# Patient Record
Sex: Male | Born: 1945 | Race: White | Hispanic: No | Smoking: Never smoker
Health system: Southern US, Community
[De-identification: ages and names within clinical notes are randomized; demographics above are authoritative.]

## PROBLEM LIST (undated history)

## (undated) DIAGNOSIS — K219 Gastro-esophageal reflux disease without esophagitis: Secondary | ICD-10-CM

## (undated) DIAGNOSIS — I639 Cerebral infarction, unspecified: Secondary | ICD-10-CM

## (undated) DIAGNOSIS — I10 Essential (primary) hypertension: Secondary | ICD-10-CM

## (undated) DIAGNOSIS — Z87442 Personal history of urinary calculi: Secondary | ICD-10-CM

## (undated) DIAGNOSIS — M1990 Unspecified osteoarthritis, unspecified site: Secondary | ICD-10-CM

## (undated) DIAGNOSIS — N189 Chronic kidney disease, unspecified: Secondary | ICD-10-CM

## (undated) HISTORY — PX: OTHER SURGICAL HISTORY: SHX169

## (undated) HISTORY — PX: EYE SURGERY: SHX253

---

## 1968-10-24 HISTORY — PX: KNEE ARTHROSCOPY W/ ACL RECONSTRUCTION: SHX1858

## 2008-11-15 ENCOUNTER — Emergency Department (HOSPITAL_BASED_OUTPATIENT_CLINIC_OR_DEPARTMENT_OTHER): Admission: EM | Admit: 2008-11-15 | Discharge: 2008-11-15 | Payer: Self-pay | Admitting: Emergency Medicine

## 2008-11-15 ENCOUNTER — Ambulatory Visit: Payer: Self-pay | Admitting: Radiology

## 2008-11-17 ENCOUNTER — Ambulatory Visit (HOSPITAL_BASED_OUTPATIENT_CLINIC_OR_DEPARTMENT_OTHER): Admission: RE | Admit: 2008-11-17 | Discharge: 2008-11-17 | Payer: Self-pay | Admitting: Urology

## 2010-01-02 ENCOUNTER — Ambulatory Visit: Payer: Self-pay | Admitting: Diagnostic Radiology

## 2010-01-02 ENCOUNTER — Emergency Department (HOSPITAL_BASED_OUTPATIENT_CLINIC_OR_DEPARTMENT_OTHER): Admission: EM | Admit: 2010-01-02 | Discharge: 2010-01-02 | Payer: Self-pay | Admitting: Emergency Medicine

## 2010-10-24 DIAGNOSIS — I639 Cerebral infarction, unspecified: Secondary | ICD-10-CM

## 2010-10-24 HISTORY — DX: Cerebral infarction, unspecified: I63.9

## 2011-02-07 LAB — POCT I-STAT 4, (NA,K, GLUC, HGB,HCT)
Potassium: 4.6 mEq/L (ref 3.5–5.1)
Sodium: 137 mEq/L (ref 135–145)

## 2011-02-07 LAB — URINALYSIS, ROUTINE W REFLEX MICROSCOPIC
Bilirubin Urine: NEGATIVE
Glucose, UA: 250 mg/dL — AB
Specific Gravity, Urine: 1.023 (ref 1.005–1.030)
Urobilinogen, UA: 0.2 mg/dL (ref 0.0–1.0)

## 2011-02-07 LAB — URINE MICROSCOPIC-ADD ON

## 2011-03-08 NOTE — Op Note (Signed)
NAME:  Alexander Morris, Alexander Morris                 ACCOUNT NO.:  [id number]   MEDICAL RECORD NO.:  [id number]          PATIENT TYPE:  AMB   LOCATION:  [facility]                         FACILITY:  [facility]   PHYSICIAN:  Excell Seltzer. Annabell Howells, M.D.    DATE OF BIRTH:  1946-03-20   DATE OF PROCEDURE:  11/17/2008  DATE OF DISCHARGE:                               OPERATIVE REPORT   PROCEDURES:  1. Cystoscopy.  2. Left retrograde pyelogram with interpretation.  3. Left ureteral dilation.  4. Left ureteroscopic stone extraction.  5. Placement of left ureteral stent.   SURGEON:  Excell Seltzer. Annabell Howells, M.D.   ANESTHESIA:  General.   SPECIMEN:  Stone.   DRAIN:  A 6-French x 24 cm double-J stent.   COMPLICATIONS:  None.   INDICATIONS:  Alexander Morris is a 65 year old white male who presented with a  3-mm radiolucent left distal ureteral stone.  He elected ureteroscopy  for treatment.   FINDINGS OF PROCEDURE:  The patient was taken to the operating room.  He  received 400 mg of Cipro.  General anesthetic was induced.  He was  placed in lithotomy position.  His perineum and genitalia were prepped  with Betadine solution.  He was draped in the usual sterile fashion.  A  time-out was performed.  Cystoscopy was performed using the 22-French  scope and 12 and 70 degree lenses.  Examination revealed a normal  urethra.  The external sphincter was intact.  The prostatic urethra was  short and bilobar hyperplasia without significant obstruction.  Examination of bladder revealed mild trabeculation, no tumors or  inflammation noted but there were some small stone fragments noted.  The  ureteral orifices are in their normal anatomic position.  There was a  bit of erythema at the left orifice.   After cystoscopy, the left ureter was cannulated with a 5-French open-  ended catheter and contrast was instilled.  This demonstrated a filling  defect approximately 3-4 cm proximal to meatus consistent with a stone.   At this point, the cystoscope  was removed and a 6-French short  ureteroscope was passed.  The scope was advanced up the left ureteral  orifice but could not be advanced to the stone due stricturing.  A  guidewire was passed through ureteroscope past the narrowed area to the  kidney.  At this point, an attempt was made to dilate the stricture with  an access sheath, 12-French inner core dilator but this would not pass.  A 4 cm 15-French balloon was then passed across the narrowing and was  dilated to 12 atmospheres with complete dilation of the stricture.  At  this point, the balloon was removed and the ureteroscope was reinserted  alongside the wire.  The stone was visualized, grasped with a nitinol  basket and removed without difficulty.  However, because of the need for  dilation and some mucosal tearing in the distal ureter related to the  dilation, I felt a stent was indicated.  The cystoscope was reinserted  over the stent of the wire and a 6-French 24 cm double-J stent with  string was passed, under fluoroscopic guidance, without difficulty to  the kidney.  The wire was removed, leaving a good coil in the kidney, a  good coil in the bladder.  The bladder was then drained.  The patient  was taken down from lithotomy position.  His anesthetic was reversed.  He was moved to the recovery room in stable condition.  There were no  complications.  His wife was given the stone and they will bring it to  my office later today for analysis.      Excell Seltzer. Annabell Howells, M.D.  Electronically Signed     JJW/MEDQ  D:  11/17/2008  T:  11/17/2008  Job:  [id number]

## 2011-08-09 ENCOUNTER — Inpatient Hospital Stay (HOSPITAL_COMMUNITY)
Admission: AD | Admit: 2011-08-09 | Discharge: 2011-08-12 | DRG: 065 | Disposition: A | Discharge status: Home / Self Care | Payer: 59 | Source: Other Acute Inpatient Hospital | Attending: Internal Medicine | Admitting: Internal Medicine

## 2011-08-09 ENCOUNTER — Emergency Department (INDEPENDENT_AMBULATORY_CARE_PROVIDER_SITE_OTHER): Payer: 59

## 2011-08-09 ENCOUNTER — Other Ambulatory Visit: Payer: Self-pay

## 2011-08-09 ENCOUNTER — Inpatient Hospital Stay (HOSPITAL_COMMUNITY): Payer: 59

## 2011-08-09 ENCOUNTER — Emergency Department (HOSPITAL_BASED_OUTPATIENT_CLINIC_OR_DEPARTMENT_OTHER)
Admission: EM | Admit: 2011-08-09 | Discharge: 2011-08-09 | Disposition: A | Discharge status: Other Acute Inpatient Hospital | Payer: 59 | Source: Home / Self Care | Attending: Emergency Medicine | Admitting: Emergency Medicine

## 2011-08-09 DIAGNOSIS — E119 Type 2 diabetes mellitus without complications: Secondary | ICD-10-CM | POA: Diagnosis present

## 2011-08-09 DIAGNOSIS — I6350 Cerebral infarction due to unspecified occlusion or stenosis of unspecified cerebral artery: Secondary | ICD-10-CM | POA: Insufficient documentation

## 2011-08-09 DIAGNOSIS — Q211 Atrial septal defect: Secondary | ICD-10-CM

## 2011-08-09 DIAGNOSIS — R209 Unspecified disturbances of skin sensation: Secondary | ICD-10-CM | POA: Insufficient documentation

## 2011-08-09 DIAGNOSIS — I10 Essential (primary) hypertension: Secondary | ICD-10-CM | POA: Insufficient documentation

## 2011-08-09 DIAGNOSIS — R42 Dizziness and giddiness: Secondary | ICD-10-CM

## 2011-08-09 DIAGNOSIS — Z7982 Long term (current) use of aspirin: Secondary | ICD-10-CM

## 2011-08-09 DIAGNOSIS — Z8249 Family history of ischemic heart disease and other diseases of the circulatory system: Secondary | ICD-10-CM

## 2011-08-09 DIAGNOSIS — Z794 Long term (current) use of insulin: Secondary | ICD-10-CM

## 2011-08-09 DIAGNOSIS — Z79899 Other long term (current) drug therapy: Secondary | ICD-10-CM

## 2011-08-09 DIAGNOSIS — R29898 Other symptoms and signs involving the musculoskeletal system: Secondary | ICD-10-CM | POA: Diagnosis present

## 2011-08-09 DIAGNOSIS — I639 Cerebral infarction, unspecified: Secondary | ICD-10-CM

## 2011-08-09 DIAGNOSIS — E669 Obesity, unspecified: Secondary | ICD-10-CM | POA: Diagnosis present

## 2011-08-09 DIAGNOSIS — Q2111 Secundum atrial septal defect: Secondary | ICD-10-CM

## 2011-08-09 HISTORY — DX: Essential (primary) hypertension: I10

## 2011-08-09 LAB — COMPREHENSIVE METABOLIC PANEL
ALT: 14 U/L (ref 0–53)
AST: 19 U/L (ref 0–37)
AST: 18 U/L (ref 0–37)
Albumin: 3.9 g/dL (ref 3.5–5.2)
BUN: 23 mg/dL (ref 6–23)
CO2: 26 mEq/L (ref 19–32)
Calcium: 9.6 mg/dL (ref 8.4–10.5)
Calcium: 9.9 mg/dL (ref 8.4–10.5)
Chloride: 102 mEq/L (ref 96–112)
Creatinine, Ser: 1.1 mg/dL (ref 0.50–1.35)
Creatinine, Ser: 1.08 mg/dL (ref 0.50–1.35)
GFR calc Af Amer: 79 mL/min — ABNORMAL LOW (ref >90)
GFR calc non Af Amer: 69 mL/min — ABNORMAL LOW (ref >90)
GFR calc non Af Amer: 70 mL/min — ABNORMAL LOW (ref >90)
Glucose, Bld: 159 mg/dL — ABNORMAL HIGH (ref 70–99)
Potassium: 4.3 mEq/L (ref 3.5–5.1)
Sodium: 140 mEq/L (ref 135–145)
Total Bilirubin: 0.3 mg/dL (ref 0.3–1.2)
Total Protein: 6.8 g/dL (ref 6.0–8.3)

## 2011-08-09 LAB — CBC
HCT: 35.9 % — ABNORMAL LOW (ref 39.0–52.0)
Hemoglobin: 12.3 g/dL — ABNORMAL LOW (ref 13.0–17.0)
MCH: 31.8 pg (ref 26.0–34.0)
MCHC: 34.5 g/dL (ref 30.0–36.0)
MCHC: 35.2 g/dL (ref 30.0–36.0)
Platelets: 179 10*3/uL (ref 150–400)
Platelets: 174 10*3/uL (ref 150–400)
RDW: 13.1 % (ref 11.5–15.5)

## 2011-08-09 LAB — TROPONIN I: Troponin I: <0.3 ng/mL (ref <0.30)

## 2011-08-09 LAB — DIFFERENTIAL
Basophils Absolute: 0 10*3/uL (ref 0.0–0.1)
Eosinophils Absolute: 0.1 10*3/uL (ref 0.0–0.7)
Lymphocytes Relative: 21 % (ref 12–46)
Lymphs Abs: 1.4 10*3/uL (ref 0.7–4.0)
Monocytes Relative: 8 % (ref 3–12)
Neutro Abs: 4.7 10*3/uL (ref 1.7–7.7)
Neutrophils Relative %: 70 % (ref 43–77)

## 2011-08-09 LAB — PROTIME-INR: INR: 0.98 (ref 0.00–1.49)

## 2011-08-09 LAB — CARDIAC PANEL(CRET KIN+CKTOT+MB+TROPI): Total CK: 110 U/L (ref 7–232)

## 2011-08-09 LAB — APTT: aPTT: 30 seconds (ref 24–37)

## 2011-08-09 MED ORDER — HYDROMORPHONE HCL 1 MG/ML IJ SOLN: 1.0000 mg | Freq: Once | INTRAMUSCULAR | Status: DC

## 2011-08-09 NOTE — ED Notes (Signed)
Swallowing screen performed per protocol.  Pt had no difficulty.  Test passed. Awaiting admission bed.  EMTALA consent obtained.  Pt informed of plan of care.

## 2011-08-09 NOTE — ED Notes (Signed)
Pt report numbness to L side of face only reports symptoms are subsiding

## 2011-08-09 NOTE — ED Provider Notes (Addendum)
History     CSN: [id number] Arrival date & time: 08/09/2011  9:50 AM Patient seen [facility] room 2 10:01 AM  Chief Complaint  Patient presents with  . Numbness  . Hypertension   Patient states numbness left side began yesterday at 8pm yesterday.  States felt weak like bs low yesterday afternoon then improved after several hours.  Patient felt better and went to [employer] and walked on treadmill a little less than usual exertion level.  Did some weight training.  On arriving home tooke bp and was a little high 150/80.  An hour later began having tingling and numbness left face and arm and leg.  No weakness.  Present constantly since last night.  Some dizziness described as having some balance problems.   (Consider location/radiation/quality/duration/timing/severity/associated sxs/prior treatment) The history is provided by the patient.    Past Medical History  Diagnosis Date  . Hypertension   . Diabetes mellitus   reviewed anemia Past Surgical History  Procedure Date  . Eye surgery    revkewed No family history on file.  History  Substance Use Topics  . Smoking status: Never Smoker   . Smokeless tobacco: Never Used  . Alcohol Use: Yes     occasional      Review of Systems  All other systems reviewed and are negative.    Allergies  Review of patient's allergies indicates no known allergies.  Home Medications  No current outpatient prescriptions on file.  BP 180/79  Pulse 70  Temp(Src) 98.3 F (36.8 C) (Oral)  Resp 16  Ht 5\' 8"  (1.727 m)  Wt 170 lb (77.111 kg)  BMI 25.85 kg/m2  SpO2 100%  Physical Exam  Nursing note and vitals reviewed. Constitutional: He is oriented to person, place, and time. He appears well-developed and well-nourished.  HENT:  Head: Normocephalic and atraumatic.  Eyes: Conjunctivae and EOM are normal. Pupils are equal, round, and reactive to light.  Neck: Normal range of motion. Neck supple.  Cardiovascular: Normal rate and regular rhythm.     Pulmonary/Chest: Effort normal and breath sounds normal.  Abdominal: Soft. Bowel sounds are normal.  Musculoskeletal: Normal range of motion.  Neurological: He is alert and oriented to person, place, and time. He has normal strength and normal reflexes. No cranial nerve deficit. He displays a negative Romberg sign. GCS eye subscore is 4. GCS verbal subscore is 5. GCS motor subscore is 6.  Skin: Skin is warm and dry.  Psychiatric: He has a normal mood and affect.    ED Course  Procedures (including critical care time)  Labs Reviewed - No data to display No results found.   No diagnosis found.    MDM  pmd Dr. Larina Bras at [facility] preference [place]   Date: 08/09/2011  Rate: 63  Rhythm: normal sinus rhythm  QRS Axis: normal  Intervals: normal  ST/T Wave abnormalities: normal  Conduction Disutrbances:none  Narrative Interpretation:   Old EKG Reviewed: none available  Patient care discussed with Dr. Viann Fish. Patient will be transported to [facility]. Patient's symptomatology concerning for stroke. He has no overt deficits here but does have decreased sensation throughout the left side.  Hilario Quarry, MD 08/09/11 [id number]  Hilario Quarry, MD 08/09/11 [phone]

## 2011-08-09 NOTE — ED Notes (Signed)
I took ecg and gave copy to Dr. Rosalia Hammers

## 2011-08-09 NOTE — ED Notes (Signed)
Pt transported to radiology.

## 2011-08-09 NOTE — ED Notes (Signed)
MD at bedside. 

## 2011-08-09 NOTE — ED Notes (Signed)
Pt reports dizziness, left sided "numbness" on left cheek, left side of mouth, left arm and leg that started last night about 8:00pm.  He also states blood pressure is elevated.

## 2011-08-09 NOTE — ED Notes (Signed)
Pt returned from radiology.

## 2011-08-09 NOTE — H&P (Signed)
NAMECOSTON, MANDATO                 ACCOUNT NO.:  [id number]  MEDICAL RECORD NO.:  [id number]  LOCATION:  3034                         FACILITY:  [facility]  PHYSICIAN:  Gery Pray, MD      DATE OF BIRTH:  July 15, 1946  DATE OF ADMISSION:  08/09/2011 DATE OF DISCHARGE:                             HISTORY & PHYSICAL   PRIMARY CARE PHYSICIAN:  Marlou Starks, MD  CODE STATUS:  Full code.  The patient goes to team 7.  CHIEF COMPLAINT:  Left-sided numbness.  HISTORY OF PRESENT ILLNESS:  This is a pleasant 65 year old gentleman, who has risk factors of diabetes mellitus, high blood pressure.  He states that he came home yesterday.  His blood pressure was 190/90.  He rechecked later and it went down to 160/80.  At that point, he went to the gym. When he came home after 45 minutes of working out, his blood pressure was still 160/80.  Approximately 1 hour later, he states he developed left- sided numbness in the arms and legs and left side of the face.  He went to bed.  When he got up this a.m. he noted the numbness was better, but he decided to follow up with his PCP.  In the PCP's office, his blood pressure was 190/89.  He was sent to the ER and transferred to [facility]  The patient states that he had no headache, he had no slurred speech, he had no blurred vision, he had no tinnitus.  He had some mild left-sided localized weakness.  No gait abnormality.  He had no problems with swallowing.  He had no altered mental status.  No fevers.  No nausea.  No vomiting.  No chills.  He has never had this type of symptom before.  He did take 2 baby aspirin yesterday before going to the gym. He states his blood pressure normally has fair control.  Systolic blood pressure usually below 145-150.  He reports he had no chest pains.  Here in the hospital, he states that his symptoms have mostly resolved.  He does still have some mild left leg numbness and some mild left facial numbness; otherwise, all  symptoms have resolved.  History obtained from the patient who appears reliable.  His wife and his daughter both at the bedside.  REVIEW OF SYSTEMS:  All 10-point systems reviewed and negative except as noted in HPI.  PAST MEDICAL HISTORY:  Significant for diabetes and hypertension.  The patient's diabetes is usually well controlled.  PAST SURGICAL HISTORY: 1. Eyes surgery. 2. Orthopedic surgeries as a teenager.  MEDICATIONS: 1. Lantus 30 units subcu every hour sleep. 2. Ramipril 10 mg daily. 3. Actos 45 mg daily.  ALLERGIES:  No known drug allergies.  SOCIAL HISTORY:  Negative tobacco, alcohol, or illicit drugs.  He lives at home with his wife.  Currently, he is on a home oxygen.  He does use a walker.  He does all of his ADLs.  FAMILY HISTORY:  Significant for coronary artery disease in his father and Parkinson's disease in his .  PHYSICAL EXAMINATION:  VITAL SIGNS:  Blood pressure 166/73, pulse 55, respirations 20, temperature 97.7, satting 100% on  room air. GENERAL:  Alert and oriented male, currently in no acute distress. HEENT:  Eyes, pink conjunctivae.  PERRLA.  ENT, moist oral mucosa. Trachea midline. NECK:  Supple.  No thyromegaly. LUNGS:  Clear to auscultation bilaterally.  No wheeze.  No use of accessory muscles. CARDIOVASCULAR:  Regular rate and rhythm without murmurs, rigors, or gallops.  No JVD. ABDOMEN:  Soft, positive bowel sounds, nontender, nondistended.  No organomegaly. NEURO:  Cranial nerves II through XII grossly intact.  Sensation on examination appears to be grossly intact. MUSCULOSKELETAL:  Strength is 5/5 in all extremities.  No clubbing, cyanosis, or edema. SKIN:  No rashes.  No subcutaneous crepitations. PSYCH:  Alert and oriented, appropriate gentleman.  LABORATORY DATA:  CT head, no acute disease.  EKG, there is no EKG noted on the chart; however, tele strips show normal sinus rhythm.  ASSESSMENT AND PLAN: 1. Transient ischemic  attack. 2. Malignant hypertension.  The patient's blood pressure is already     significantly improved.  The patient will be brought in TIA/CVA     orders and further imaging will be done including an echo and     carotid ultrasound.  Aspirin will be ordered daily.  IV fluids.  We     will monitor patient on tele.  Labetalol p.r.n. systolic blood     pressure greater than 190.  We will obtain an EKG.  PT will see the     patient.  The patient has already been evaluated with swallow study     which he has passed and resume ADA diet.  A lipid panel will also     be ordered, and if needed, cholesterol medications will be added. 3. Diabetes mellitus.  We will resume home medications.  Place the     patient on ADA diet and continue to monitor.          ______________________________ Gery Pray, MD     DC/MEDQ  D:  08/09/2011  T:  08/09/2011  Job:  [id number]  Electronically Signed by Gery Pray MD on 08/09/2011 08:34:49 PM

## 2011-08-10 LAB — HEMOGLOBIN A1C
Hgb A1c MFr Bld: 6.3 % — ABNORMAL HIGH (ref <5.7)
Mean Plasma Glucose: 134 mg/dL — ABNORMAL HIGH (ref <117)

## 2011-08-10 LAB — CBC
MCH: 31.9 pg (ref 26.0–34.0)
Platelets: 171 10*3/uL (ref 150–400)
RBC: 3.76 MIL/uL — ABNORMAL LOW (ref 4.22–5.81)

## 2011-08-10 LAB — BASIC METABOLIC PANEL
CO2: 26 mEq/L (ref 19–32)
Calcium: 9.5 mg/dL (ref 8.4–10.5)
GFR calc non Af Amer: 51 mL/min — ABNORMAL LOW (ref >90)
Potassium: 3.9 mEq/L (ref 3.5–5.1)
Sodium: 141 mEq/L (ref 135–145)

## 2011-08-10 LAB — URINALYSIS, ROUTINE W REFLEX MICROSCOPIC
Glucose, UA: NEGATIVE mg/dL
Hgb urine dipstick: NEGATIVE
Leukocytes, UA: NEGATIVE
Protein, ur: NEGATIVE mg/dL
Specific Gravity, Urine: 1.022 (ref 1.005–1.030)
pH: 5.5 (ref 5.0–8.0)

## 2011-08-10 LAB — LIPID PANEL
Cholesterol: 120 mg/dL (ref 0–200)
HDL: 30 mg/dL — ABNORMAL LOW (ref >39)
Triglycerides: 110 mg/dL (ref <150)
VLDL: 22 mg/dL (ref 0–40)

## 2011-08-10 LAB — GLUCOSE, CAPILLARY: Glucose-Capillary: 72 mg/dL (ref 70–99)

## 2011-08-11 ENCOUNTER — Inpatient Hospital Stay (HOSPITAL_COMMUNITY): Payer: 59

## 2011-08-11 DIAGNOSIS — I517 Cardiomegaly: Secondary | ICD-10-CM

## 2011-08-12 LAB — GLUCOSE, CAPILLARY
Glucose-Capillary: 68 mg/dL — ABNORMAL LOW (ref 70–99)
Glucose-Capillary: 78 mg/dL (ref 70–99)

## 2011-08-22 NOTE — Discharge Summary (Signed)
NAMEJASHER, Alexander Morris                 ACCOUNT NO.:  [id number]  MEDICAL RECORD NO.:  [id number]  LOCATION:  3034                         FACILITY:  [facility]  PHYSICIAN:  Altha Harm, MDDATE OF BIRTH:  [birth year]  DATE OF ADMISSION:  08/09/2011 DATE OF DISCHARGE:  08/12/2011                              DISCHARGE SUMMARY   DISCHARGE DISPOSITION:  Home.  FINAL DISCHARGE DIAGNOSES: 1. Right thalamic infarct. 2. Possible atrial septal defect. 3. Hypertension. 4. Diabetes type 2.  DISCHARGE MEDICATIONS: 1. Aspirin 325 mg p.o. daily. 2. Hydrochlorothiazide 12.5 mg p.o. daily. 3. Potassium chloride 20 mEq p.o. daily. 4. Crestor 20 mg p.o. daily. 5. Iron sulfate 325 mg p.o. daily with meals. 6. Lantus 30 units subcutaneously at bedtime. 7. Actos 45 mg p.o. daily. 8. Ramipril 10 mg p.o. daily.  CONSULTANTS:  Dr. Thad Ranger and Pramod P. Pearlean Brownie, MD, Neurology.  PROCEDURES:  None.  DIAGNOSTIC STUDIES: 1. 2D echocardiogram which shows left ventricular cavity size normal     with mildly increased wall thickness in the pattern of left     ventricular hypertrophy.  Left ventricular function preserved with     an ejection fraction of 60%.  Cannot rule out possibilities of a     small shunt at atrial septal level. 2. CT of the head without contrast which shows no acute intracranial     abnormalities. 3. Two-view chest x-ray which shows no acute abnormalities. 4. MRI of the head shows acute or subacute nonhemorrhagic infarct     involving the right lateral thalamus, mild generalized atrophy,     minimal sinus disease. 5. MRA of the brain which shows mild narrowing of the proximal right     vertebral artery, measuring less than 50% relative to the distal     vessel.  No other significant proximal stenosis, aneurysm, or     branch-vessel occlusion.  PRIMARY CARE PHYSICIAN:  Marlou Starks, MD  ALLERGIES:  No known drug allergies.  CODE STATUS:  Full code.  CHIEF COMPLAINT:   Left-sided numbness of face.  HISTORY OF PRESENT ILLNESS:  Please refer to the H and P by Dr. Joneen Roach for details of the HPI.  However, in short, this is a 65 year old gentleman with a history of diabetes type 2, hypertension who presented to the emergency room with elevated blood pressure of 190/90.  The patient states that he came home and after 45 minutes of working out, his blood pressure is still elevated.  He developed left-sided numbness of the arms, legs, and left side of his face.  The patient went to bed, got up and noticed that the arms and legs are better, but he still had persistent numbness of the face.  He decided to follow up in his primary care physician's office.  At his primary care physician's office, his blood pressure was 190/98 and he was sent to the ER for further evaluation and management.  HOSPITAL COURSE: 1. Right thalamic CVA.  The patient certainly presented with symptoms     consistent with a CVA.  The patient had a CT scan which showed no     infarct, however, subsequent MRI showed right thalamic infarct. MRA  was done to evaluate the vessels which showed no significant     vessel involvement.  There was a 2D echocardiogram which showed a     suggestion of an ASD.  The patient was initially scheduled for     bubble study, however, Dr. Pearlean Brownie saw the patient today and     recommended the patient can go home without the bubble study, and     follow up as an outpatient for this particular since the patient     was eager to be discharged from the hospital.  He felt that the     patient's risk was not significantly increased and that the     patient, regardless, would be of aspirin which he is going home on.     The patient is being discharged on aspirin 325 mg p.o. and also on     Crestor for independent stroke risk reduction. 2. Malignant hypertension.  The patient's blood pressures were     significantly elevated.  Here in the hospital, the blood pressures      have been ranging in the 150 systolic.  The patient is being     discharged home on ramipril and hydrochlorothiazide has also been     added to aid in the lowering of his blood pressure.  The patient     will likely need to have further titration of his medications.  His     goal blood pressure is 125/85. 3. Diabetes type 2.  Blood pressures are well controlled.  Hemoglobin     A1c was just slightly elevated.  The patient is continued on his     pioglitazone.  At the time of discharge, the patient is stable.  PHYSICAL EXAMINATION:  VITAL SIGNS:  Temperature is 97.9, heart rate 68, blood pressure 154/80, respiratory rate 18, O2 sats are 99% on room air. HEENT:  Normocephalic, atraumatic.  Pupils equal, round, and reactive to light and accommodation.  Extraocular movements are intact.  Oropharynx is moist.  No exudate, erythema, or lesions are noted. NECK:  Trachea is midline.  No masses.  No thyromegaly.  No JVD.  No carotid bruit. RESPIRATORY:  The patient has a normal respiratory effort.  Equal excursion bilaterally.  No wheezing or rhonchi noted. CARDIOVASCULAR:  He has got a normal S1, S2.  No murmurs, rubs, or gallops noted.  PMI is nondisplaced.  No heaves or thrills on palpation. ABDOMEN:  Obese, soft, nontender, nondistended.  No masses.  No hepatosplenomegaly noted. EXTREMITIES:  Showed no clubbing, cyanosis, or edema. PSYCHIATRIC:  Alert and oriented x3.  Good insight and cognition.  Good recent and remote recall.  The patient appears to have no focal neurological deficits as a result of a CVA.  He has some just very mild left facial numbness.  DIETARY RESTRICTIONS:  The patient should be on a diabetic heart-healthy diet.  PHYSICAL RESTRICTIONS:  None.  FOLLOWUP:  The patient is to follow up with his primary care physician, Dr. Marlou Starks, within 3-5 days.  He is also to follow up with Neurology within 2 months, the number has been provided.  Total time for this  discharge process including face-to-face time approximately 40 minutes.     Altha Harm, MD     MAM/MEDQ  D:  08/12/2011  T:  08/13/2011  Job:  [id number]  cc:   Dr. Kandice Moos P. Pearlean Brownie, MD Dr. Joneen Roach Marlou Starks, MD  Electronically Signed by Marthann Schiller MD on 08/22/2011 01:08:40 PM

## 2014-01-06 ENCOUNTER — Other Ambulatory Visit: Payer: Self-pay | Admitting: Urology

## 2014-01-06 ENCOUNTER — Encounter (HOSPITAL_COMMUNITY): Payer: Self-pay | Admitting: Pharmacy Technician

## 2014-01-06 ENCOUNTER — Encounter (HOSPITAL_COMMUNITY)
Admission: AD | Disposition: A | Discharge status: Home / Self Care | Payer: Self-pay | Source: Ambulatory Visit | Attending: Urology

## 2014-01-06 ENCOUNTER — Encounter (HOSPITAL_COMMUNITY): Payer: Self-pay | Admitting: *Deleted

## 2014-01-06 ENCOUNTER — Ambulatory Visit (HOSPITAL_COMMUNITY)
Admission: AD | Admit: 2014-01-06 | Discharge: 2014-01-06 | Disposition: A | Discharge status: Home / Self Care | Payer: 59 | Source: Ambulatory Visit | Attending: Urology | Admitting: Urology

## 2014-01-06 ENCOUNTER — Ambulatory Visit (HOSPITAL_COMMUNITY): Payer: 59

## 2014-01-06 ENCOUNTER — Encounter (HOSPITAL_COMMUNITY): Payer: 59 | Admitting: Certified Registered Nurse Anesthetist

## 2014-01-06 ENCOUNTER — Ambulatory Visit (HOSPITAL_COMMUNITY): Payer: 59 | Admitting: Certified Registered Nurse Anesthetist

## 2014-01-06 DIAGNOSIS — N201 Calculus of ureter: Secondary | ICD-10-CM

## 2014-01-06 DIAGNOSIS — E119 Type 2 diabetes mellitus without complications: Secondary | ICD-10-CM | POA: Insufficient documentation

## 2014-01-06 DIAGNOSIS — N1330 Unspecified hydronephrosis: Secondary | ICD-10-CM | POA: Insufficient documentation

## 2014-01-06 DIAGNOSIS — R82998 Other abnormal findings in urine: Secondary | ICD-10-CM | POA: Insufficient documentation

## 2014-01-06 DIAGNOSIS — N200 Calculus of kidney: Secondary | ICD-10-CM | POA: Insufficient documentation

## 2014-01-06 DIAGNOSIS — Z794 Long term (current) use of insulin: Secondary | ICD-10-CM | POA: Insufficient documentation

## 2014-01-06 DIAGNOSIS — I10 Essential (primary) hypertension: Secondary | ICD-10-CM | POA: Insufficient documentation

## 2014-01-06 HISTORY — PX: CYSTOSCOPY WITH RETROGRADE PYELOGRAM, URETEROSCOPY AND STENT PLACEMENT: SHX5789

## 2014-01-06 LAB — CBC
HCT: 36.4 % — ABNORMAL LOW (ref 39.0–52.0)
Hemoglobin: 12.1 g/dL — ABNORMAL LOW (ref 13.0–17.0)
MCH: 30.9 pg (ref 26.0–34.0)
MCHC: 33.2 g/dL (ref 30.0–36.0)
MCV: 92.9 fL (ref 78.0–100.0)
PLATELETS: 191 10*3/uL (ref 150–400)
RBC: 3.92 MIL/uL — ABNORMAL LOW (ref 4.22–5.81)
RDW: 14.1 % (ref 11.5–15.5)
WBC: 12.4 10*3/uL — ABNORMAL HIGH (ref 4.0–10.5)

## 2014-01-06 LAB — BASIC METABOLIC PANEL
BUN: 33 mg/dL — ABNORMAL HIGH (ref 6–23)
CO2: 25 meq/L (ref 19–32)
Calcium: 9.4 mg/dL (ref 8.4–10.5)
Chloride: 96 mEq/L (ref 96–112)
Creatinine, Ser: 2.64 mg/dL — ABNORMAL HIGH (ref 0.50–1.35)
GFR calc non Af Amer: 23 mL/min — ABNORMAL LOW (ref >90)
GFR, EST AFRICAN AMERICAN: 27 mL/min — AB (ref >90)
Glucose, Bld: 196 mg/dL — ABNORMAL HIGH (ref 70–99)
POTASSIUM: 4.3 meq/L (ref 3.7–5.3)
Sodium: 136 mEq/L — ABNORMAL LOW (ref 137–147)

## 2014-01-06 LAB — GLUCOSE, CAPILLARY
GLUCOSE-CAPILLARY: 162 mg/dL — AB (ref 70–99)
Glucose-Capillary: 181 mg/dL — ABNORMAL HIGH (ref 70–99)

## 2014-01-06 SURGERY — CYSTOURETEROSCOPY, WITH RETROGRADE PYELOGRAM AND STENT INSERTION
Anesthesia: General | Site: Ureter | Laterality: Left

## 2014-01-06 MED ORDER — FENTANYL CITRATE 0.05 MG/ML IJ SOLN
INTRAMUSCULAR | Status: AC
  Filled 2014-01-06: qty 2

## 2014-01-06 MED ORDER — FENTANYL CITRATE 0.05 MG/ML IJ SOLN
INTRAMUSCULAR | Status: DC | PRN
  Administered 2014-01-06 (×2): 50 ug via INTRAVENOUS

## 2014-01-06 MED ORDER — SENNOSIDES-DOCUSATE SODIUM 8.6-50 MG PO TABS: 1.0000 | ORAL_TABLET | Freq: Two times a day (BID) | ORAL | Status: DC

## 2014-01-06 MED ORDER — PHENAZOPYRIDINE HCL 100 MG PO TABS: 100.0000 mg | ORAL_TABLET | Freq: Three times a day (TID) | ORAL | Status: DC | PRN

## 2014-01-06 MED ORDER — LIDOCAINE HCL 2 % EX GEL
CUTANEOUS | Status: DC | PRN
  Administered 2014-01-06: 1 via URETHRAL

## 2014-01-06 MED ORDER — BELLADONNA ALKALOIDS-OPIUM 16.2-60 MG RE SUPP
RECTAL | Status: AC
  Filled 2014-01-06: qty 1

## 2014-01-06 MED ORDER — METOCLOPRAMIDE HCL 5 MG/ML IJ SOLN
INTRAMUSCULAR | Status: AC
  Filled 2014-01-06: qty 2

## 2014-01-06 MED ORDER — MIDAZOLAM HCL 5 MG/5ML IJ SOLN
INTRAMUSCULAR | Status: DC | PRN
  Administered 2014-01-06: 2 mg via INTRAVENOUS

## 2014-01-06 MED ORDER — LIDOCAINE HCL (CARDIAC) 20 MG/ML IV SOLN
INTRAVENOUS | Status: DC | PRN
  Administered 2014-01-06: 100 mg via INTRAVENOUS

## 2014-01-06 MED ORDER — METOCLOPRAMIDE HCL 5 MG/ML IJ SOLN
INTRAMUSCULAR | Status: DC | PRN
  Administered 2014-01-06: 10 mg via INTRAVENOUS

## 2014-01-06 MED ORDER — ONDANSETRON HCL 4 MG/2ML IJ SOLN
INTRAMUSCULAR | Status: AC
  Filled 2014-01-06: qty 2

## 2014-01-06 MED ORDER — CEFAZOLIN SODIUM-DEXTROSE 2-3 GM-% IV SOLR
2.0000 g | INTRAVENOUS | Status: AC
  Administered 2014-01-06: 2 g via INTRAVENOUS

## 2014-01-06 MED ORDER — OXYBUTYNIN CHLORIDE 5 MG PO TABS: 5.0000 mg | ORAL_TABLET | Freq: Four times a day (QID) | ORAL | Status: DC | PRN

## 2014-01-06 MED ORDER — ONDANSETRON HCL 4 MG/2ML IJ SOLN
INTRAMUSCULAR | Status: DC | PRN
  Administered 2014-01-06: 4 mg via INTRAVENOUS

## 2014-01-06 MED ORDER — SODIUM CHLORIDE 0.9 % IR SOLN
Status: DC | PRN
  Administered 2014-01-06: 3000 mL via INTRAVESICAL

## 2014-01-06 MED ORDER — IOHEXOL 350 MG/ML SOLN
INTRAVENOUS | Status: DC | PRN
  Administered 2014-01-06: 8 mL

## 2014-01-06 MED ORDER — MIDAZOLAM HCL 2 MG/2ML IJ SOLN
INTRAMUSCULAR | Status: AC
  Filled 2014-01-06: qty 2

## 2014-01-06 MED ORDER — DEXAMETHASONE SODIUM PHOSPHATE 10 MG/ML IJ SOLN
INTRAMUSCULAR | Status: AC
  Filled 2014-01-06: qty 1

## 2014-01-06 MED ORDER — LIDOCAINE HCL 2 % EX GEL
CUTANEOUS | Status: AC
  Filled 2014-01-06: qty 10

## 2014-01-06 MED ORDER — 0.9 % SODIUM CHLORIDE (POUR BTL) OPTIME
TOPICAL | Status: DC | PRN
  Administered 2014-01-06: 1000 mL

## 2014-01-06 MED ORDER — BELLADONNA ALKALOIDS-OPIUM 16.2-60 MG RE SUPP
RECTAL | Status: DC | PRN
  Administered 2014-01-06: 1 via RECTAL

## 2014-01-06 MED ORDER — LACTATED RINGERS IV SOLN
INTRAVENOUS | Status: DC | PRN
  Administered 2014-01-06: 20:00:00 via INTRAVENOUS

## 2014-01-06 MED ORDER — CEFAZOLIN SODIUM-DEXTROSE 2-3 GM-% IV SOLR
INTRAVENOUS | Status: AC
  Filled 2014-01-06: qty 50

## 2014-01-06 MED ORDER — OXYCODONE-ACETAMINOPHEN 5-325 MG PO TABS: 1.0000 | ORAL_TABLET | ORAL | Status: DC | PRN

## 2014-01-06 MED ORDER — PROPOFOL 10 MG/ML IV BOLUS
INTRAVENOUS | Status: AC
  Filled 2014-01-06: qty 20

## 2014-01-06 MED ORDER — CIPROFLOXACIN HCL 500 MG PO TABS: 500.0000 mg | ORAL_TABLET | Freq: Two times a day (BID) | ORAL | Status: DC

## 2014-01-06 MED ORDER — TAMSULOSIN HCL 0.4 MG PO CAPS: 0.4000 mg | ORAL_CAPSULE | Freq: Every day | ORAL | Status: DC

## 2014-01-06 MED ORDER — PROPOFOL 10 MG/ML IV BOLUS
INTRAVENOUS | Status: DC | PRN
  Administered 2014-01-06: 250 mg via INTRAVENOUS

## 2014-01-06 SURGICAL SUPPLY — 28 items
BAG URO CATCHER STRL LF (DRAPE) ×2 IMPLANT
BASKET LASER NITINOL 1.9FR (BASKET) IMPLANT
BASKET STNLS GEMINI 4WIRE 3FR (BASKET) IMPLANT
BASKET ZERO TIP NITINOL 2.4FR (BASKET) IMPLANT
CATH CLEAR GEL 3F BACKSTOP (CATHETERS) IMPLANT
CATH URET 5FR 28IN CONE TIP (BALLOONS)
CATH URET 5FR 28IN OPEN ENDED (CATHETERS) ×2 IMPLANT
CATH URET 5FR 70CM CONE TIP (BALLOONS) IMPLANT
CATH URET DUAL LUMEN 6-10FR 50 (CATHETERS) IMPLANT
CLOTH BEACON ORANGE TIMEOUT ST (SAFETY) ×2 IMPLANT
DRAPE CAMERA CLOSED 9X96 (DRAPES) ×2 IMPLANT
FIBER LASER FLEXIVA 200 (UROLOGICAL SUPPLIES) IMPLANT
FIBER LASER FLEXIVA 365 (UROLOGICAL SUPPLIES) IMPLANT
GLOVE BIOGEL M 7.0 STRL (GLOVE) ×2 IMPLANT
GLOVE BIOGEL PI IND STRL 7.0 (GLOVE) ×2 IMPLANT
GLOVE BIOGEL PI INDICATOR 7.0 (GLOVE) ×2
GOWN STRL REUS W/TWL LRG LVL3 (GOWN DISPOSABLE) ×4 IMPLANT
GUIDEWIRE ANG ZIPWIRE 038X150 (WIRE) IMPLANT
GUIDEWIRE STR DUAL SENSOR (WIRE) ×2 IMPLANT
IV NS IRRIG 3000ML ARTHROMATIC (IV SOLUTION) ×2 IMPLANT
PACK CYSTO (CUSTOM PROCEDURE TRAY) ×2 IMPLANT
SCRUB PCMX 4 OZ (MISCELLANEOUS) ×2 IMPLANT
SHEATH ACCESS URETERAL 38CM (SHEATH) IMPLANT
SHEATH URET ACCESS 12FR/35CM (UROLOGICAL SUPPLIES) IMPLANT
SHEATH URET ACCESS 12FR/55CM (UROLOGICAL SUPPLIES) IMPLANT
STENT POLARIS 5FRX24 (STENTS) ×2 IMPLANT
SYRINGE IRR TOOMEY STRL 70CC (SYRINGE) IMPLANT
TUBING CONNECTING 10 (TUBING) ×2 IMPLANT

## 2014-01-06 NOTE — Anesthesia Preprocedure Evaluation (Addendum)
Anesthesia Evaluation  Patient identified by MRN, date of birth, ID band Patient awake    Reviewed: Allergy & Precautions, H&P , NPO status , Patient's Chart, lab work & pertinent test results  Airway Mallampati: II TM Distance: >3 FB Neck ROM: Full    Dental no notable dental hx.    Pulmonary neg pulmonary ROS,  breath sounds clear to auscultation  Pulmonary exam normal       Cardiovascular hypertension, Pt. on medications negative cardio ROS  Rhythm:Regular Rate:Normal     Neuro/Psych negative neurological ROS  negative psych ROS   GI/Hepatic negative GI ROS, Neg liver ROS,   Endo/Other  diabetes, Type 2, Insulin Dependent, Oral Hypoglycemic Agents  Renal/GU negative Renal ROS  negative genitourinary   Musculoskeletal negative musculoskeletal ROS (+)   Abdominal   Peds negative pediatric ROS (+)  Hematology negative hematology ROS (+)   Anesthesia Other Findings   Reproductive/Obstetrics negative OB ROS                          Anesthesia Physical Anesthesia Plan  ASA: III  Anesthesia Plan: General   Post-op Pain Management:    Induction: Intravenous  Airway Management Planned: Oral ETT  Additional Equipment:   Intra-op Plan:   Post-operative Plan: Extubation in OR  Informed Consent: I have reviewed the patients History and Physical, chart, labs and discussed the procedure including the risks, benefits and alternatives for the proposed anesthesia with the patient or authorized representative who has indicated his/her understanding and acceptance.   Dental advisory given  Plan Discussed with: CRNA  Anesthesia Plan Comments:         Anesthesia Quick Evaluation

## 2014-01-06 NOTE — H&P (Signed)
Urology History and Physical Exam  CC: Left ureter stone. History of left ureter stricture. Possible UTI.  HPI:    68 year old male patient previously saw Dr. Annabell HowellsWrenn in 2010 for stones and left ureter stricture recurs today with several problems.   #1 pyuria.  This is a new problem.  He's had this previously.  This is an acute issue.  He has too numerous to count white blood cells and red blood cells as well as positive side esterase and nitrites.  Does not associate with fever.  Nothing makes this better or worse.  #2 nephrolithiasis.  Chronic problem.  This is an acute onset.  This began Saturday.  Located on the left side.  Left-sided ointment pain.  Associated with radiation to the ipsilateral abdomen.  Sharp and intermittent in nature.  Associated with nausea and emesis.  Not associated with fever.  We discussed imaging options which include CT, KUB, and ultrasound along with risks, limitations, and benefits.  He has not had a CT since 2010.  He elected to proceed with CT abdomen and pelvis stone protocol which was done in urology clinic today. No kidney stones identified on CT today.  #3 left ureter stone.  This located at the level of the aortic bifurcation.  This is possibly 6.5 mm in size.  Density: 558 HU.  Positive proximal hydronephrosis.  Positive perinephric stranding. We discussed management options along with risks, benefits, side effects, alternatives, and likelihood of achieving goals.   PMH: Past Medical History  Diagnosis Date  . Hypertension   . Diabetes mellitus     PSH: Past Surgical History  Procedure Laterality Date  . Eye surgery      Allergies: No Known Allergies  Medications: No prescriptions prior to admission     Social History: History   Social History  . Marital Status: Married    Spouse Name: N/A    Number of Children: N/A  . Years of Education: N/A   Occupational History  . Not on file.   Social History Main Topics  . Smoking status:  Never Smoker   . Smokeless tobacco: Never Used  . Alcohol Use: Yes     Comment: occasional  . Drug Use: No  . Sexual Activity:    Other Topics Concern  . Not on file   Social History Narrative  . No narrative on file    Family History: No family history on file.  Review of Systems: Positive: Polydipsia, nausea, emesis, constipation. Negative: Fever, chest pain, or gross hematuria.  A further 10 point review of systems was negative except what is listed in the HPI.  Physical Exam: Filed Vitals:   01/06/14 1740  BP: 172/77  Pulse: 83  Temp: 98.2 F (36.8 C)  Resp: 22    General: No acute distress.  Awake. Head:  Normocephalic.  Atraumatic. ENT:  EOMI.  Mucous membranes moist Neck:  Supple.  No lymphadenopathy. CV:  S1 present. S2 present. Regular rate. Pulmonary: Equal effort bilaterally.  Clear to auscultation bilaterally. Abdomen: Soft.  Non- tender to palpation. Skin:  Normal turgor.  No visible rash. Extremity: No gross deformity of bilateral upper extremities.  No gross deformity of    bilateral lower extremities. Neurologic: Alert. Appropriate mood.    Studies:  No results found for this basename: HGB, WBC, PLT,  in the last 72 hours  No results found for this basename: NA, K, CL, CO2, BUN, CREATININE, CALCIUM, MAGNESIUM, GFRNONAA, GFRAA,  in the last 72 hours  No results found for this basename: PT, INR, APTT,  in the last 72 hours   No components found with this basename: ABG,     Assessment:  Left ureter stone. History of left ureter stricture. Possible UTI.  Plan:  Urine culture from clinic pending.  Given that he likely has an infection, history of a left ureter stricture, and a current left ureter stone with some obstruction, I recommended proceeding to operating room today for cystoscopy, left vertebra program, left ureter stent placement, and possible left ureter dilation.

## 2014-01-06 NOTE — Transfer of Care (Signed)
Immediate Anesthesia Transfer of Care Note  Patient: Alexander Morris  Procedure(s) Performed: Procedure(s) (LRB): CYSTOSCOPY WITH  LEFT RETROGRADE PYELOGRAM, AND LEFT STENT PLACEMENT  (Left)  Patient Location: PACU  Anesthesia Type: General  Level of Consciousness: sedated, patient cooperative and responds to stimulation  Airway & Oxygen Therapy: Patient Spontanous Breathing and Patient connected to face mask oxgen  Post-op Assessment: Report given to PACU RN and Post -op Vital signs reviewed and stable  Post vital signs: Reviewed and stable  Complications: No apparent anesthesia complications

## 2014-01-06 NOTE — Op Note (Signed)
Urology Operative Report  Date of Procedure: 01/06/14  Surgeon: Natalia Leatherwoodaniel Daney Moor, MD Assistant:  None  Preoperative Diagnosis: Left ureter stone. Possible UTI. Postoperative Diagnosis: Same  Procedure(s): Left ureter stent placement. Left retrograde pyelogram with interpretation. Cystoscopy  Estimated blood loss: none  Specimen: None  Drains: None  Complications: None  Findings: Left mid-proximal ureter filling defect consistent with known left ureter stone. Return of dark urinary sediment. No evidence of left ureter stricture on retrograde pyelogram.  History of present illness: Patient presents for possible urinary tract infection. He has a left ureter stone. He presents for left ureter stent. 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   Procedure in detail: After informed consent was obtained, the patient was taken to the operating room. They were placed in the supine position. SCDs were turned on and in place. IV antibiotics were infused, and general anesthesia was induced. A timeout was performed in which the correct patient, surgical site, and procedure were identified and agreed upon by the team.  The patient was placed in a dorsolithotomy position, making sure to pad all pertinent neurovascular pressure points. The genitals were prepped and draped in the usual sterile fashion.  A rigid cystoscope was best the urethra and into the bladder. The bladder was drained and then fully distended. It was evaluated in a systematic fashion to visualize the entire bladder. This negative for tumors.  Attention was turned to the left ureter orifice. It was cannulated with a 5 [place] ureter catheter. I injected 8 cc of Omnipaque to obtain a retrograde pyelogram. This was positive for filling defect in the mid proximal ureter consistent with the known stone. This was negative for any narrowing or stricture. Curiously, did not note any hydronephrosis of the renal pelvis.  I  placed a sensor wire through the left ureter orifice and up into the left renal pelvis on fluoroscopy. Over the wire I placed a 5 x 24 Polaris stent without tether. This was placed with ease. It was deployed with a good curl in the left renal pelvis and the loops within the bladder. There was return of dark urinary sediment.  The bladder was drained, the cystoscope was removed, I placed 10 cc of lidocaine jelly into the urethra, and I placed a belladonna and opium suppository to the rectum.  This completed the procedure, he's placed back in a supine position, anesthesia was reversed, and he was taken to the PACU in stable condition.  All counts were correct at the end of the case.  I will give him ciprofloxacin for 7 days in followup the results of the urine culture. He will be scheduled for interval ureteroscopy for his stone.

## 2014-01-06 NOTE — Discharge Instructions (Signed)
DISCHARGE INSTRUCTIONS FOR KIDNEY STONES OR URETERAL STENT ° °MEDICATIONS:  ° °1.  Resume all your other meds from home. ° °ACTIVITY °1. No strenuous activity x 1week °2. No driving while on narcotic pain medications °3. Drink plenty of water °4. Continue to walk at home - you can still get blood clots when you are at home, so keep active, but don't over do it. °5. May return to work in 3 days. ° °BATHING °1. You can shower or take a bath. ° ° ° °SIGNS/SYMPTOMS TO CALL: °1. Please call us if you have a fever greater than 101.5, uncontrolled  °nausea/vomiting, uncontrolled pain, dizziness, unable to urinate, chest pain, shortness of breath, leg swelling, leg pain, redness around wound, drainage from wound, or any other concerns or questions. ° °You can reach us at [phone]. ° ° °

## 2014-01-07 ENCOUNTER — Encounter (HOSPITAL_COMMUNITY): Payer: Self-pay | Admitting: Urology

## 2014-01-07 NOTE — Anesthesia Postprocedure Evaluation (Signed)
  Anesthesia Post-op Note  Patient: Alexander Morris  Procedure(s) Performed: Procedure(s) (LRB): CYSTOSCOPY WITH  LEFT RETROGRADE PYELOGRAM, AND LEFT STENT PLACEMENT  (Left)  Patient Location: PACU  Anesthesia Type: General  Level of Consciousness: awake and alert   Airway and Oxygen Therapy: Patient Spontanous Breathing  Post-op Pain: mild  Post-op Assessment: Post-op Vital signs reviewed, Patient's Cardiovascular Status Stable, Respiratory Function Stable, Patent Airway and No signs of Nausea or vomiting  Last Vitals:  Filed Vitals:   01/06/14 2208  BP: 154/58  Pulse:   Temp:   Resp: 15    Post-op Vital Signs: stable   Complications: No apparent anesthesia complications

## 2014-01-10 ENCOUNTER — Other Ambulatory Visit: Payer: Self-pay | Admitting: Urology

## 2014-01-20 ENCOUNTER — Ambulatory Visit (HOSPITAL_COMMUNITY)
Admission: RE | Admit: 2014-01-20 | Discharge: 2014-01-20 | Disposition: A | Discharge status: Home / Self Care | Payer: 59 | Source: Ambulatory Visit | Attending: Urology | Admitting: Urology

## 2014-01-20 ENCOUNTER — Encounter (HOSPITAL_COMMUNITY): Payer: Self-pay

## 2014-01-20 ENCOUNTER — Encounter (HOSPITAL_COMMUNITY): Payer: Self-pay | Admitting: Pharmacy Technician

## 2014-01-20 ENCOUNTER — Encounter (HOSPITAL_COMMUNITY)
Admission: RE | Admit: 2014-01-20 | Discharge: 2014-01-20 | Disposition: A | Discharge status: Home / Self Care | Payer: 59 | Source: Ambulatory Visit | Attending: Urology | Admitting: Urology

## 2014-01-20 DIAGNOSIS — Z01812 Encounter for preprocedural laboratory examination: Secondary | ICD-10-CM | POA: Insufficient documentation

## 2014-01-20 DIAGNOSIS — I10 Essential (primary) hypertension: Secondary | ICD-10-CM | POA: Insufficient documentation

## 2014-01-20 DIAGNOSIS — Z01818 Encounter for other preprocedural examination: Secondary | ICD-10-CM | POA: Insufficient documentation

## 2014-01-20 HISTORY — DX: Personal history of urinary calculi: Z87.442

## 2014-01-20 HISTORY — DX: Cerebral infarction, unspecified: I63.9

## 2014-01-20 HISTORY — DX: Unspecified osteoarthritis, unspecified site: M19.90

## 2014-01-20 LAB — CBC
HCT: 32.9 % — ABNORMAL LOW (ref 39.0–52.0)
Hemoglobin: 10.7 g/dL — ABNORMAL LOW (ref 13.0–17.0)
MCH: 30.3 pg (ref 26.0–34.0)
MCHC: 32.5 g/dL (ref 30.0–36.0)
MCV: 93.2 fL (ref 78.0–100.0)
PLATELETS: 209 10*3/uL (ref 150–400)
RBC: 3.53 MIL/uL — AB (ref 4.22–5.81)
RDW: 13.6 % (ref 11.5–15.5)
WBC: 7 10*3/uL (ref 4.0–10.5)

## 2014-01-20 LAB — BASIC METABOLIC PANEL
BUN: 18 mg/dL (ref 6–23)
CO2: 25 meq/L (ref 19–32)
CREATININE: 1.49 mg/dL — AB (ref 0.50–1.35)
Calcium: 9.5 mg/dL (ref 8.4–10.5)
Chloride: 104 mEq/L (ref 96–112)
GFR calc non Af Amer: 47 mL/min — ABNORMAL LOW (ref >90)
GFR, EST AFRICAN AMERICAN: 54 mL/min — AB (ref >90)
Glucose, Bld: 130 mg/dL — ABNORMAL HIGH (ref 70–99)
Potassium: 4.4 mEq/L (ref 3.7–5.3)
SODIUM: 140 meq/L (ref 137–147)

## 2014-01-20 NOTE — Pre-Procedure Instructions (Signed)
EKG REPORT IN EPIC FROM 01-06-14. CXR REPORT IN EPIC FROM 01/06/14 - ABNORMAL "SMALL LEFT PLEURAL EFFUSION" - CXR REPEATED TODAY PREOP .

## 2014-01-20 NOTE — Patient Instructions (Addendum)
   [facility]  ON:  Tuesday  4/7  REPORT TO  SHORT STAY CENTER AT:  5:30 AM      PHONE # FOR SHORT STAY IS [phone]  DO NOT EAT OR DRINK ANYTHING AFTER MIDNIGHT THE NIGHT BEFORE YOUR SURGERY.  YOU MAY BRUSH YOUR TEETH, RINSE OUT YOUR MOUTH--BUT NO WATER, NO FOOD, NO CHEWING GUM, NO MINTS, NO CANDIES, NO CHEWING TOBACCO.  PLEASE TAKE THE FOLLOWING MEDICATIONS THE AM OF YOUR SURGERY WITH A FEW SIPS OF WATER:   DILTIAZEM, CRESTOR.  OXYCODONE / ACETAMINOPHEN IF NEEDED FOR PAIN.   IF YOU ARE DIABETIC:  DO NOT TAKE ANY DIABETIC MEDICATIONS THE AM OF YOUR SURGERY.  IF YOU TAKE INSULIN IN THE EVENINGS--PLEASE ONLY TAKE 1/2 NORMAL EVENING DOSE THE NIGHT BEFORE YOUR SURGERY.  NO INSULIN THE AM OF YOUR SURGERY.   DO NOT BRING VALUABLES, MONEY, CREDIT CARDS.  DO NOT WEAR JEWELRY, MAKE-UP, NAIL POLISH AND NO METAL PINS OR CLIPS IN YOUR HAIR. CONTACT LENS, DENTURES / PARTIALS, GLASSES SHOULD NOT BE WORN TO SURGERY AND IN MOST CASES-HEARING AIDS WILL NEED TO BE REMOVED.  BRING YOUR GLASSES CASE, ANY EQUIPMENT NEEDED FOR YOUR CONTACT LENS. FOR PATIENTS ADMITTED TO THE HOSPITAL--CHECK OUT TIME THE DAY OF DISCHARGE IS 11:00 AM.  ALL INPATIENT ROOMS ARE PRIVATE - WITH BATHROOM, TELEPHONE, TELEVISION AND WIFI INTERNET.  IF YOU ARE BEING DISCHARGED THE SAME DAY OF YOUR SURGERY--YOU CAN NOT DRIVE YOURSELF HOME--AND SHOULD NOT GO HOME ALONE BY TAXI OR BUS.  NO DRIVING OR OPERATING MACHINERY, OR MAKING LEGAL DECISIONS FOR 24 HOURS FOLLOWING ANESTHESIA / PAIN MEDICATIONS.  PLEASE MAKE ARRANGEMENTS FOR SOMEONE TO BE WITH YOU AT HOME THE FIRST 24 HOURS AFTER SURGERY. RESPONSIBLE DRIVER'S NAME / PHONE                                                    FAILURE TO FOLLOW THESE INSTRUCTIONS MAY RESULT IN THE CANCELLATION OF YOUR SURGERY. PLEASE BE AWARE THAT YOU MAY NEED ADDITIONAL BLOOD DRAWN DAY OF YOUR SURGERY  PATIENT SIGNATURE_________________________________

## 2014-01-27 NOTE — H&P (Signed)
Urology History and Physical Exam  CC: Left ureter stone.  HPI:  68 year old male presents today for left ureter stone.  This was discovered on CT scan on 01/06/14.  It is 7 mm in size.  Location at the aortic bifurcation.  It was associated with left-sided flank pain. There are no stones in the left kidney. Left ureter stent was placed 01/06/14.  History of left distal ureter stricture, but no stricture was noted on retrograde pyelogram during stent placement.  We have discussed management options along with risks, benefits, alternatives, side effects, and likelihood of achieving goals.  He presents today for cystoscopy, left ureteroscopy, laser lithotripsy, left ureter stent exchange, and possible left retrograde pyelogram.  Urine culture 01/13/14 was negative for growth.  PMH: Past Medical History  Diagnosis Date  . Hypertension   . Diabetes mellitus     PT ON INSULIN - TYPE II  . Stroke 2012    MINI-STROKE-EXPERIENCE SLIGHT NUMBNESS LEFT SIDE AND ELEVATED B/P--STILL HAS SOME NUMBNESS AND WEAKNESS BUT ABLE TO USE ARM AND LEG  . History of kidney stones     MOST RECENT - 01/06/14 - HAD CYSTO AND STENT PLACEMENT -LEFT  . Arthritis     MINOR - HANDS    PSH: Past Surgical History  Procedure Laterality Date  . Cystoscopy with retrograde pyelogram, ureteroscopy and stent placement Left 01/06/2014    Procedure: CYSTOSCOPY WITH  LEFT RETROGRADE PYELOGRAM, AND LEFT STENT PLACEMENT ;  Surgeon: Milford Cageaniel Young Corian Handley, MD;  Location: [facility];  Service: Urology;  Laterality: Left;  Marland Kitchen. Eye surgery      LASER BOTH EYES FOR DIABETIC COMPLICATIONS  . 2010 removal of kidney stone / cyst0      Allergies: Allergies  Allergen Reactions  . Scallops [Shellfish Allergy] Nausea And Vomiting    Medications: No prescriptions prior to admission     Social History: History   Social History  . Marital Status: Married    Spouse Name: N/A    Number of Children: N/A  . Years of Education: N/A    Occupational History  . Not on file.   Social History Main Topics  . Smoking status: Never Smoker   . Smokeless tobacco: Never Used  . Alcohol Use: Yes     Comment: occasional  . Drug Use: No  . Sexual Activity: Not on file   Other Topics Concern  . Not on file   Social History Narrative  . No narrative on file    Family History: No family history on file.  Review of Systems: Positive: Urgency. Negative: Fever, SOB, or chest pain.  A further 10 point review of systems was negative except what is listed in the HPI.  Physical Exam: Filed Vitals:   01/28/14 0530  BP: 155/67  Pulse: 56  Temp: 98.5 F (36.9 C)  Resp: 18    General: No acute distress.  Awake. Head:  Normocephalic.  Atraumatic. ENT:  EOMI.  Mucous membranes moist Neck:  Supple.  No lymphadenopathy. CV:  S1 present. S2 present. Regular rate. Pulmonary: Equal effort bilaterally.  Clear to auscultation bilaterally. Abdomen: Soft.  Non- tender to palpation. Skin:  Normal turgor.  No visible rash. Extremity: No gross deformity of bilateral upper extremities.  No gross deformity of    bilateral lower extremities. Neurologic: Alert. Appropriate mood.    Studies:  No results found for this basename: HGB, WBC, PLT,  in the last 72 hours  No results found for this basename: NA, K, CL,  CO2, BUN, CREATININE, CALCIUM, MAGNESIUM, GFRNONAA, GFRAA,  in the last 72 hours   No results found for this basename: PT, INR, APTT,  in the last 72 hours   No components found with this basename: ABG,     Assessment:  Left ureter stone.  Plan: To OR  for cystoscopy, left ureteroscopy, laser lithotripsy, left ureter stent exchange, and possible left retrograde pyelogram.

## 2014-01-28 ENCOUNTER — Encounter (HOSPITAL_COMMUNITY): Payer: Self-pay | Admitting: *Deleted

## 2014-01-28 ENCOUNTER — Encounter (HOSPITAL_COMMUNITY): Payer: 59 | Admitting: Certified Registered Nurse Anesthetist

## 2014-01-28 ENCOUNTER — Ambulatory Visit (HOSPITAL_COMMUNITY): Payer: 59 | Admitting: Certified Registered Nurse Anesthetist

## 2014-01-28 ENCOUNTER — Encounter (HOSPITAL_COMMUNITY)
Admission: RE | Disposition: A | Discharge status: Home / Self Care | Payer: Self-pay | Source: Ambulatory Visit | Attending: Urology

## 2014-01-28 ENCOUNTER — Ambulatory Visit (HOSPITAL_COMMUNITY)
Admission: RE | Admit: 2014-01-28 | Discharge: 2014-01-28 | Disposition: A | Discharge status: Home / Self Care | Payer: 59 | Source: Ambulatory Visit | Attending: Urology | Admitting: Urology

## 2014-01-28 DIAGNOSIS — N201 Calculus of ureter: Secondary | ICD-10-CM | POA: Diagnosis present

## 2014-01-28 DIAGNOSIS — R209 Unspecified disturbances of skin sensation: Secondary | ICD-10-CM | POA: Insufficient documentation

## 2014-01-28 DIAGNOSIS — Z794 Long term (current) use of insulin: Secondary | ICD-10-CM | POA: Insufficient documentation

## 2014-01-28 DIAGNOSIS — E119 Type 2 diabetes mellitus without complications: Secondary | ICD-10-CM | POA: Diagnosis not present

## 2014-01-28 DIAGNOSIS — I69998 Other sequelae following unspecified cerebrovascular disease: Secondary | ICD-10-CM | POA: Insufficient documentation

## 2014-01-28 DIAGNOSIS — R29898 Other symptoms and signs involving the musculoskeletal system: Secondary | ICD-10-CM | POA: Diagnosis not present

## 2014-01-28 DIAGNOSIS — I10 Essential (primary) hypertension: Secondary | ICD-10-CM | POA: Insufficient documentation

## 2014-01-28 HISTORY — PX: HOLMIUM LASER APPLICATION: SHX5852

## 2014-01-28 HISTORY — PX: CYSTOSCOPY WITH RETROGRADE PYELOGRAM, URETEROSCOPY AND STENT PLACEMENT: SHX5789

## 2014-01-28 LAB — GLUCOSE, CAPILLARY: Glucose-Capillary: 80 mg/dL (ref 70–99)

## 2014-01-28 SURGERY — CYSTOURETEROSCOPY, WITH RETROGRADE PYELOGRAM AND STENT INSERTION
Anesthesia: General | Site: Ureter | Laterality: Left

## 2014-01-28 MED ORDER — LACTATED RINGERS IV SOLN: INTRAVENOUS | Status: DC

## 2014-01-28 MED ORDER — OXYCODONE-ACETAMINOPHEN 5-325 MG PO TABS: 1.0000 | ORAL_TABLET | ORAL | Status: DC | PRN

## 2014-01-28 MED ORDER — BELLADONNA ALKALOIDS-OPIUM 16.2-60 MG RE SUPP
RECTAL | Status: AC
  Filled 2014-01-28: qty 1

## 2014-01-28 MED ORDER — PROMETHAZINE HCL 25 MG/ML IJ SOLN: 6.2500 mg | INTRAMUSCULAR | Status: DC | PRN

## 2014-01-28 MED ORDER — LIDOCAINE HCL 2 % EX GEL
CUTANEOUS | Status: DC | PRN
  Administered 2014-01-28: 1 via URETHRAL

## 2014-01-28 MED ORDER — FENTANYL CITRATE 0.05 MG/ML IJ SOLN
INTRAMUSCULAR | Status: DC | PRN
  Administered 2014-01-28 (×2): 25 ug via INTRAVENOUS
  Administered 2014-01-28 (×2): 50 ug via INTRAVENOUS

## 2014-01-28 MED ORDER — HYDROMORPHONE HCL PF 1 MG/ML IJ SOLN: 0.2500 mg | INTRAMUSCULAR | Status: DC | PRN

## 2014-01-28 MED ORDER — LIDOCAINE HCL 2 % EX GEL
CUTANEOUS | Status: AC
  Filled 2014-01-28: qty 10

## 2014-01-28 MED ORDER — BELLADONNA ALKALOIDS-OPIUM 16.2-60 MG RE SUPP
RECTAL | Status: DC | PRN
  Administered 2014-01-28: 1 via RECTAL

## 2014-01-28 MED ORDER — LACTATED RINGERS IV SOLN
INTRAVENOUS | Status: DC | PRN
  Administered 2014-01-28 (×2): via INTRAVENOUS

## 2014-01-28 MED ORDER — CEFAZOLIN SODIUM-DEXTROSE 2-3 GM-% IV SOLR
2.0000 g | INTRAVENOUS | Status: AC
  Administered 2014-01-28: 2 g via INTRAVENOUS

## 2014-01-28 MED ORDER — ONDANSETRON HCL 4 MG/2ML IJ SOLN
INTRAMUSCULAR | Status: DC | PRN
  Administered 2014-01-28 (×2): 2 mg via INTRAVENOUS

## 2014-01-28 MED ORDER — KETOROLAC TROMETHAMINE 30 MG/ML IJ SOLN
INTRAMUSCULAR | Status: DC | PRN
  Administered 2014-01-28: 30 mg via INTRAVENOUS

## 2014-01-28 MED ORDER — SODIUM CHLORIDE 0.9 % IR SOLN
Status: DC | PRN
  Administered 2014-01-28: 1000 mL

## 2014-01-28 MED ORDER — 0.9 % SODIUM CHLORIDE (POUR BTL) OPTIME
TOPICAL | Status: DC | PRN
  Administered 2014-01-28: 4000 mL

## 2014-01-28 MED ORDER — PROPOFOL 10 MG/ML IV BOLUS
INTRAVENOUS | Status: DC | PRN
  Administered 2014-01-28: 30 mg via INTRAVENOUS
  Administered 2014-01-28: 170 mg via INTRAVENOUS

## 2014-01-28 MED ORDER — CEPHALEXIN 500 MG PO CAPS: 500.0000 mg | ORAL_CAPSULE | Freq: Three times a day (TID) | ORAL | Status: DC

## 2014-01-28 MED ORDER — CEFAZOLIN SODIUM-DEXTROSE 2-3 GM-% IV SOLR
INTRAVENOUS | Status: AC
  Filled 2014-01-28: qty 50

## 2014-01-28 MED ORDER — LIDOCAINE HCL (CARDIAC) 20 MG/ML IV SOLN
INTRAVENOUS | Status: DC | PRN
  Administered 2014-01-28: 75 mg via INTRAVENOUS

## 2014-01-28 MED ORDER — MIDAZOLAM HCL 5 MG/5ML IJ SOLN
INTRAMUSCULAR | Status: DC | PRN
  Administered 2014-01-28 (×2): 0.5 mg via INTRAVENOUS

## 2014-01-28 SURGICAL SUPPLY — 31 items
BAG URO CATCHER STRL LF (DRAPE) ×4 IMPLANT
BASKET LASER NITINOL 1.9FR (BASKET) IMPLANT
BASKET STNLS GEMINI 4WIRE 3FR (BASKET) IMPLANT
BASKET ZERO TIP NITINOL 2.4FR (BASKET) IMPLANT
CATH CLEAR GEL 3F BACKSTOP (CATHETERS) ×4 IMPLANT
CATH URET 5FR 28IN CONE TIP (BALLOONS)
CATH URET 5FR 28IN OPEN ENDED (CATHETERS) IMPLANT
CATH URET 5FR 70CM CONE TIP (BALLOONS) IMPLANT
CATH URET DUAL LUMEN 6-10FR 50 (CATHETERS) IMPLANT
CLOTH BEACON ORANGE TIMEOUT ST (SAFETY) ×4 IMPLANT
DRAPE CAMERA CLOSED 9X96 (DRAPES) ×4 IMPLANT
EXTRACTOR STONE NITINOL NGAGE (UROLOGICAL SUPPLIES) ×4 IMPLANT
FIBER LASER FLEXIVA 200 (UROLOGICAL SUPPLIES) ×4 IMPLANT
FIBER LASER FLEXIVA 365 (UROLOGICAL SUPPLIES) IMPLANT
GLOVE BIOGEL M 7.0 STRL (GLOVE) ×4 IMPLANT
GOWN STRL REUS W/TWL LRG LVL3 (GOWN DISPOSABLE) ×4 IMPLANT
GOWN STRL REUS W/TWL XL LVL3 (GOWN DISPOSABLE) ×12 IMPLANT
GUIDEWIRE ANG ZIPWIRE 038X150 (WIRE) IMPLANT
GUIDEWIRE STR DUAL SENSOR (WIRE) ×8 IMPLANT
IV NS IRRIG 3000ML ARTHROMATIC (IV SOLUTION) IMPLANT
MANIFOLD NEPTUNE II (INSTRUMENTS) ×4 IMPLANT
PACK CYSTO (CUSTOM PROCEDURE TRAY) ×4 IMPLANT
SCRUB PCMX 4 OZ (MISCELLANEOUS) IMPLANT
SHEATH ACCESS URETERAL 38CM (SHEATH) ×4 IMPLANT
SHEATH URET ACCESS 12FR/35CM (UROLOGICAL SUPPLIES) IMPLANT
SHEATH URET ACCESS 12FR/55CM (UROLOGICAL SUPPLIES) IMPLANT
STENT POLARIS 5FRX24 (STENTS) ×4 IMPLANT
SYRINGE IRR TOOMEY STRL 70CC (SYRINGE) IMPLANT
TUBING CONNECTING 10 (TUBING) ×3 IMPLANT
TUBING CONNECTING 10' (TUBING) ×1
VALVE ENDOSCOPIC W/ADAPTER (MISCELLANEOUS) ×4 IMPLANT

## 2014-01-28 NOTE — Op Note (Signed)
Urology Operative Report  Date of Procedure: 01/28/14  Surgeon: Natalia Leatherwoodaniel Lennis Korb, MD Assistant:  None  Preoperative Diagnosis: Left ureter stone. Postoperative Diagnosis:  Same  Procedure(s): Left ureteroscopy with laser lithotripsy and stone removal. Left ureter stent placement (5 x 24 polaris with tether). Left ureter stent removal. Cystoscopy.  Estimated blood loss: None  Specimen: Stones sent to [facility] for analysis.  Drains: None  Complications: None  Findings: Left distal ureter stone.  History of present illness: 68 year old male presents for a left ureter stone. He had a left ureter stent placed previously.   Procedure in detail: After informed consent was obtained, the patient was taken to the operating room. They were placed in the supine position. SCDs were turned on and in place. IV antibiotics were infused, and general anesthesia was induced. A timeout was performed in which the correct patient, surgical site, and procedure were identified and agreed upon by the team.  The patient was placed in a dorsolithotomy position, making sure to pad all pertinent neurovascular pressure points. The genitals were prepped and draped in the usual sterile fashion.  A rigid cystoscope was advanced through the urethra and into the bladder. Attention was turned to the left ureter orifice. A stent grasper was used to remove the stent to the urethral meatus. A sensor wire was placed up the stent into the left renal pelvis on fluoroscopy. The stent was removed. The wire was secured as a safety wire. A second sensor wire was placed as a working wire up the left ureter and into the left renal pelvis.  I then placed the internal obturator to the 12-14 ureter access sheath over the working wire under fluoroscopy into the distal ureter. This went with ease. I then placed the obturator and sheath over the wire under fluoroscopy with ease into the distal ureter. I removed the obturator and wire.  I  then placed the flexible digital ureter scope through the access sheath into the left ureter. I was able to navigate to where I encountered the stone which had migrated down to the distal ureter. This was too large to be removed. Lithotripsy was carried out with a 200  holmium laser filament at a setting of 0.5 J and 5 Hz. Backstop gel was placed proximal to the stone under fluoroscopy. The stone was fragmented into smaller pieces and these pieces were removed with a basket.  After all stones were removed I then navigated the ureteroscope up the ureter to the renal pelvis. The renal calyces were evaluated in a systematic fashion. There were no remaining stone fragments noted. I withdrew the ureter scope to visualize the entire ureter. There is no injury to the mucosa or ureter.  Due to mild amount of edema I elected to place a ureter stent. I placed a 5 x 24 Polaris stent over the safety wire through the cystoscope and deployed with a good curl in the left renal pelvis and the loops in the bladder. The tethering string was left intact. I then drained the bladder of the stone fragments and these were sent to [facility] for chemical analysis.  The bladder was drained, I placed 10 cc of lidocaine jelly into the urethra, and I placed a belladonna and opium suppository into the rectum. This completed the procedure, anesthesia was reversed, was placed back in supine position, and taken to the PACU in a stable condition.  All counts were correct at the end of the case.  He will remove the stent this  coming Friday; he'll start Keflex the day prior.

## 2014-01-28 NOTE — Anesthesia Preprocedure Evaluation (Signed)
Anesthesia Evaluation  Patient identified by MRN, date of birth, ID band Patient awake    Reviewed: Allergy & Precautions, H&P , NPO status , Patient's Chart, lab work & pertinent test results  Airway Mallampati: II TM Distance: >3 FB Neck ROM: Full    Dental no notable dental hx. (+) Caps, Teeth Intact, Dental Advisory Given   Pulmonary neg pulmonary ROS,  breath sounds clear to auscultation  Pulmonary exam normal       Cardiovascular hypertension, Pt. on medications negative cardio ROS  Rhythm:Regular Rate:Normal     Neuro/Psych CVA negative neurological ROS  negative psych ROS   GI/Hepatic negative GI ROS, Neg liver ROS,   Endo/Other  diabetes, Type 2, Insulin Dependent  Renal/GU negative Renal ROS  negative genitourinary   Musculoskeletal negative musculoskeletal ROS (+)   Abdominal   Peds negative pediatric ROS (+)  Hematology negative hematology ROS (+)   Anesthesia Other Findings   Reproductive/Obstetrics negative OB ROS                           Anesthesia Physical Anesthesia Plan  ASA: III  Anesthesia Plan: General   Post-op Pain Management:    Induction: Intravenous  Airway Management Planned: LMA  Additional Equipment:   Intra-op Plan:   Post-operative Plan: Extubation in OR  Informed Consent: I have reviewed the patients History and Physical, chart, labs and discussed the procedure including the risks, benefits and alternatives for the proposed anesthesia with the patient or authorized representative who has indicated his/her understanding and acceptance.   Dental advisory given  Plan Discussed with: CRNA  Anesthesia Plan Comments:         Anesthesia Quick Evaluation

## 2014-01-28 NOTE — Anesthesia Procedure Notes (Signed)
Procedure Name: LMA Insertion Date/Time: 01/28/2014 7:33 AM Performed by: Edison PaceGRAY, Mischa Pollard E Pre-anesthesia Checklist: Patient identified, Patient being monitored, Timeout performed, Emergency Drugs available and Suction available Patient Re-evaluated:Patient Re-evaluated prior to inductionOxygen Delivery Method: Circle system utilized Preoxygenation: Pre-oxygenation with 100% oxygen Intubation Type: IV induction LMA: LMA inserted LMA Size: 4.0 Number of attempts: 1 Placement Confirmation: positive ETCO2 Tube secured with: Tape Dental Injury: Teeth and Oropharynx as per pre-operative assessment

## 2014-01-28 NOTE — Transfer of Care (Signed)
Immediate Anesthesia Transfer of Care Note  Patient: Alexander Morris  Procedure(s) Performed: Procedure(s) with comments: CYSTOSCOPY WITH RETROGRADE PYELOGRAM, URETEROSCOPY AND STENT PLACEMENT (Left) - LEFT URETER STENT EXCHANGE POSSIBLE LEFT URETER DILATION       HOLMIUM LASER APPLICATION (Left)  Patient Location: PACU  Anesthesia Type:General  Level of Consciousness: awake, alert , oriented and patient cooperative  Airway & Oxygen Therapy: Patient Spontanous Breathing and Patient connected to face mask oxygen  Post-op Assessment: Report given to PACU RN, Post -op Vital signs reviewed and stable and Patient moving all extremities  Post vital signs: Reviewed and stable  Complications: No apparent anesthesia complications

## 2014-01-28 NOTE — Discharge Instructions (Signed)
DISCHARGE INSTRUCTIONS FOR KIDNEY STONES OR URETERAL STENT  MEDICATIONS:   1. Resume all your other meds from home.  2. Start the antibiotic, keflex, the day before you remove your stent.  ACTIVITY 1. No strenuous activity x 1week 2. No driving while on narcotic pain medications 3. Drink plenty of water 4. Continue to walk at home - you can still get blood clots when you are at home, so keep active, but don't over do it. 5. May return to work in 3 days.  BATHING 1. You can shower. 2. If you have a string coming from your urethra:  The stent string is attached to your ureteral stent.  Do not pull on this.  If the stent gets pulled our partially before it is time to remove it, go ahead and remove the entire stent.  Call if you develop significant pain that lasts more than an hour.    SIGNS/SYMPTOMS TO CALL: 1. Please call us if you have a fever greater than 101.5, uncontrolled  nausea/vomiting, uncontrolled pain, dizziness, unable to urinate, chest pain, shortness of breath, leg swelling, leg pain, redness around wound, drainage from wound, or any other concerns or questions.  You can reach us at [phone].  FOLLOW-UP 1. If you have a string attached to your stent, you may remove it on Friday, 01/31/14.  To do this, pull the string until the stent is completely removed.  You may feel an odd sensation in your back.

## 2014-01-28 NOTE — Anesthesia Postprocedure Evaluation (Signed)
Anesthesia Post Note  Patient: Alexander Morris  Procedure(s) Performed: Procedure(s) (LRB): CYSTOSCOPY WITH RETROGRADE PYELOGRAM, URETEROSCOPY AND STENT PLACEMENT (Left) HOLMIUM LASER APPLICATION (Left)  Anesthesia type: General  Patient location: PACU  Post pain: Pain level controlled  Post assessment: Post-op Vital signs reviewed  Last Vitals:  Filed Vitals:   01/28/14 1046  BP: 135/59  Pulse: 54  Temp: 36.5 C  Resp: 16    Post vital signs: Reviewed  Level of consciousness: sedated  Complications: No apparent anesthesia complications

## 2014-01-29 ENCOUNTER — Encounter (HOSPITAL_COMMUNITY): Payer: Self-pay | Admitting: Urology

## 2014-05-22 ENCOUNTER — Emergency Department (HOSPITAL_BASED_OUTPATIENT_CLINIC_OR_DEPARTMENT_OTHER)
Admission: EM | Admit: 2014-05-22 | Discharge: 2014-05-22 | Disposition: A | Discharge status: Home / Self Care | Payer: 59 | Attending: Emergency Medicine | Admitting: Emergency Medicine

## 2014-05-22 ENCOUNTER — Emergency Department (HOSPITAL_BASED_OUTPATIENT_CLINIC_OR_DEPARTMENT_OTHER): Payer: 59

## 2014-05-22 ENCOUNTER — Encounter (HOSPITAL_BASED_OUTPATIENT_CLINIC_OR_DEPARTMENT_OTHER): Payer: Self-pay | Admitting: Emergency Medicine

## 2014-05-22 DIAGNOSIS — Z23 Encounter for immunization: Secondary | ICD-10-CM | POA: Insufficient documentation

## 2014-05-22 DIAGNOSIS — Z8673 Personal history of transient ischemic attack (TIA), and cerebral infarction without residual deficits: Secondary | ICD-10-CM | POA: Diagnosis not present

## 2014-05-22 DIAGNOSIS — M129 Arthropathy, unspecified: Secondary | ICD-10-CM | POA: Diagnosis not present

## 2014-05-22 DIAGNOSIS — Z87442 Personal history of urinary calculi: Secondary | ICD-10-CM | POA: Diagnosis not present

## 2014-05-22 DIAGNOSIS — I10 Essential (primary) hypertension: Secondary | ICD-10-CM | POA: Insufficient documentation

## 2014-05-22 DIAGNOSIS — Z794 Long term (current) use of insulin: Secondary | ICD-10-CM | POA: Diagnosis not present

## 2014-05-22 DIAGNOSIS — Z87828 Personal history of other (healed) physical injury and trauma: Secondary | ICD-10-CM | POA: Insufficient documentation

## 2014-05-22 DIAGNOSIS — E119 Type 2 diabetes mellitus without complications: Secondary | ICD-10-CM | POA: Diagnosis not present

## 2014-05-22 DIAGNOSIS — Z79899 Other long term (current) drug therapy: Secondary | ICD-10-CM | POA: Insufficient documentation

## 2014-05-22 DIAGNOSIS — Z7982 Long term (current) use of aspirin: Secondary | ICD-10-CM | POA: Insufficient documentation

## 2014-05-22 DIAGNOSIS — M7989 Other specified soft tissue disorders: Secondary | ICD-10-CM | POA: Diagnosis not present

## 2014-05-22 MED ORDER — TETANUS-DIPHTH-ACELL PERTUSSIS 5-2.5-18.5 LF-MCG/0.5 IM SUSP
0.5000 mL | Freq: Once | INTRAMUSCULAR | Status: AC
  Administered 2014-05-22: 0.5 mL via INTRAMUSCULAR
  Filled 2014-05-22: qty 0.5

## 2014-05-22 NOTE — ED Provider Notes (Signed)
Medical screening examination/treatment/procedure(s) were performed by non-physician practitioner and as supervising physician I was immediately available for consultation/collaboration.   EKG Interpretation None       Ethelda ChickMartha K Linker, MD 05/22/14 [phone]

## 2014-05-22 NOTE — ED Provider Notes (Signed)
CSN: [id number]     Arrival date & time 05/22/14  1235 History   First MD Initiated Contact with Patient 05/22/14 1245     No chief complaint on file.    (Consider location/radiation/quality/duration/timing/severity/associated sxs/prior Treatment) Patient is a 68 y.o. male presenting with hand pain. The history is provided by the patient. No language interpreter was used.  Hand Pain This is a new problem. The current episode started in the past 7 days. The problem occurs constantly. The problem has been gradually worsening. Associated symptoms include joint swelling and myalgias. Nothing aggravates the symptoms. He has tried nothing for the symptoms. The treatment provided moderate relief.  Pt reports he cut finger 5 days ago.  The cut has healed but he has swelling to knuckle above an a firm area white streak to the side of finger.   Past Medical History  Diagnosis Date  . Hypertension   . Diabetes mellitus     PT ON INSULIN - TYPE II  . Stroke 2012    MINI-STROKE-EXPERIENCE SLIGHT NUMBNESS LEFT SIDE AND ELEVATED B/P--STILL HAS SOME NUMBNESS AND WEAKNESS BUT ABLE TO USE ARM AND LEG  . History of kidney stones     MOST RECENT - 01/06/14 - HAD CYSTO AND STENT PLACEMENT -LEFT  . Arthritis     MINOR - HANDS   Past Surgical History  Procedure Laterality Date  . Cystoscopy with retrograde pyelogram, ureteroscopy and stent placement Left 01/06/2014    Procedure: CYSTOSCOPY WITH  LEFT RETROGRADE PYELOGRAM, AND LEFT STENT PLACEMENT ;  Surgeon: Milford Cage, MD;  Location: [facility];  Service: Urology;  Laterality: Left;  [facility] Eye surgery      LASER BOTH EYES FOR DIABETIC COMPLICATIONS  . 2010 removal of kidney stone / cyst0    . Cystoscopy with retrograde pyelogram, ureteroscopy and stent placement Left 01/28/2014    Procedure: CYSTOSCOPY WITH RETROGRADE PYELOGRAM, URETEROSCOPY AND STENT PLACEMENT;  Surgeon: Milford Cage, MD;  Location: [facility];  Service: Urology;  Laterality: Left;   LEFT URETER STENT EXCHANGE  LEFT URETER DILATION        . Holmium laser application Left 01/28/2014    Procedure: HOLMIUM LASER APPLICATION;  Surgeon: Milford Cage, MD;  Location: [facility];  Service: Urology;  Laterality: Left;   No family history on file. History  Substance Use Topics  . Smoking status: Never Smoker   . Smokeless tobacco: Never Used  . Alcohol Use: Yes     Comment: occasional    Review of Systems  Musculoskeletal: Positive for joint swelling and myalgias.  Skin: Positive for wound.  All other systems reviewed and are negative.     Allergies  Scallops  Home Medications   Prior to Admission medications   Medication Sig Start Date End Date Taking? Authorizing Provider  aspirin EC 325 MG tablet Take 325 mg by mouth daily.    Historical Provider, MD  cephALEXin (KEFLEX) 500 MG capsule Take 1 capsule (500 mg total) by mouth 3 (three) times daily. Start on Thursday, 01/30/14. 01/28/14   Magdalene Molly, MD  diltiazem (DILACOR XR) 180 MG 24 hr capsule Take 180 mg by mouth every morning.     Historical Provider, MD  insulin glargine (LANTUS) 100 UNIT/ML injection Inject 30 Units into the skin at bedtime.     Historical Provider, MD  oxybutynin (DITROPAN) 5 MG tablet Take 1 tablet (5 mg total) by mouth every 6 (six) hours as needed for bladder spasms. 01/06/14  Magdalene Mollyaniel Y Woodruff, MD  oxyCODONE-acetaminophen (PERCOCET) 5-325 MG per tablet Take 1-2 tablets by mouth every 4 (four) hours as needed. 01/28/14   Magdalene Mollyaniel Y Woodruff, MD  phenazopyridine (PYRIDIUM) 100 MG tablet Take 1 tablet (100 mg total) by mouth every 8 (eight) hours as needed for pain (Burning urination.  Will turn urine and body fluids orange.). 01/06/14   Magdalene Mollyaniel Y Woodruff, MD  pioglitazone (ACTOS) 45 MG tablet Take 45 mg by mouth every morning.     Historical Provider, MD  Polyethyl Glycol-Propyl Glycol (SYSTANE) 0.4-0.3 % SOLN Place 1 drop into both eyes 3 (three) times daily as needed (dry eyes).     Historical Provider, MD  ramipril (ALTACE) 10 MG capsule Take 10 mg by mouth every morning.     Historical Provider, MD  rosuvastatin (CRESTOR) 20 MG tablet Take 20 mg by mouth every morning.     Historical Provider, MD  senna-docusate (SENOKOT S) 8.6-50 MG per tablet Take 1 tablet by mouth 2 (two) times daily. 01/06/14   Magdalene Mollyaniel Y Woodruff, MD  tamsulosin (FLOMAX) 0.4 MG CAPS capsule Take 1 capsule (0.4 mg total) by mouth at bedtime. 01/06/14   Magdalene Mollyaniel Y Woodruff, MD   BP 161/64  Pulse 68  Temp(Src) 97.8 F (36.6 C) (Oral)  Resp 20  Ht 5\' 8"  (1.727 m)  Wt 170 lb (77.111 kg)  BMI 25.85 kg/m2  SpO2 99% Physical Exam  Nursing note and vitals reviewed. Constitutional: He appears well-nourished.  Musculoskeletal: He exhibits tenderness.  Firm white area opposite site as healed abrasion,  Tender to touch,  Slight swelling to knuckle  From nv and ns intact  Neurological: He is alert.  Skin: Skin is warm.  Psychiatric: He has a normal mood and affect.    ED Course  Procedures (including critical care time) Labs Review Labs Reviewed - No data to display  Imaging Review No results found.   EKG Interpretation None      MDM I doubt infection,   I suspect trumatic swelling.   I will splint and have pt follow up with Dr. Pearletha Forgehudnall for recheck in 2 days   Final diagnoses:  Swelling of finger, right    Finger splint Tetanus     Elson AreasLeslie K Everli Rother, PA-C 05/22/14 1350

## 2014-05-22 NOTE — ED Notes (Signed)
5 days ago he had a cut on his right index finger. It healed but now he has a slightly raised ridge on the length of the finger. Wants to make sure he does not have infection in his finger. No redness or swelling.

## 2015-08-22 IMAGING — CR DG CHEST 2V
2 series · 2 of 2 positions shown · non-contrast
Comparison: CT UROGRAM dated 01/06/2014; DG CHEST 2 VIEW dated
08/09/2011

CLINICAL DATA: Hypertension and diabetes. Preoperative for ureteral
stone.

EXAM:
CHEST  2 VIEW

[w chest pa]
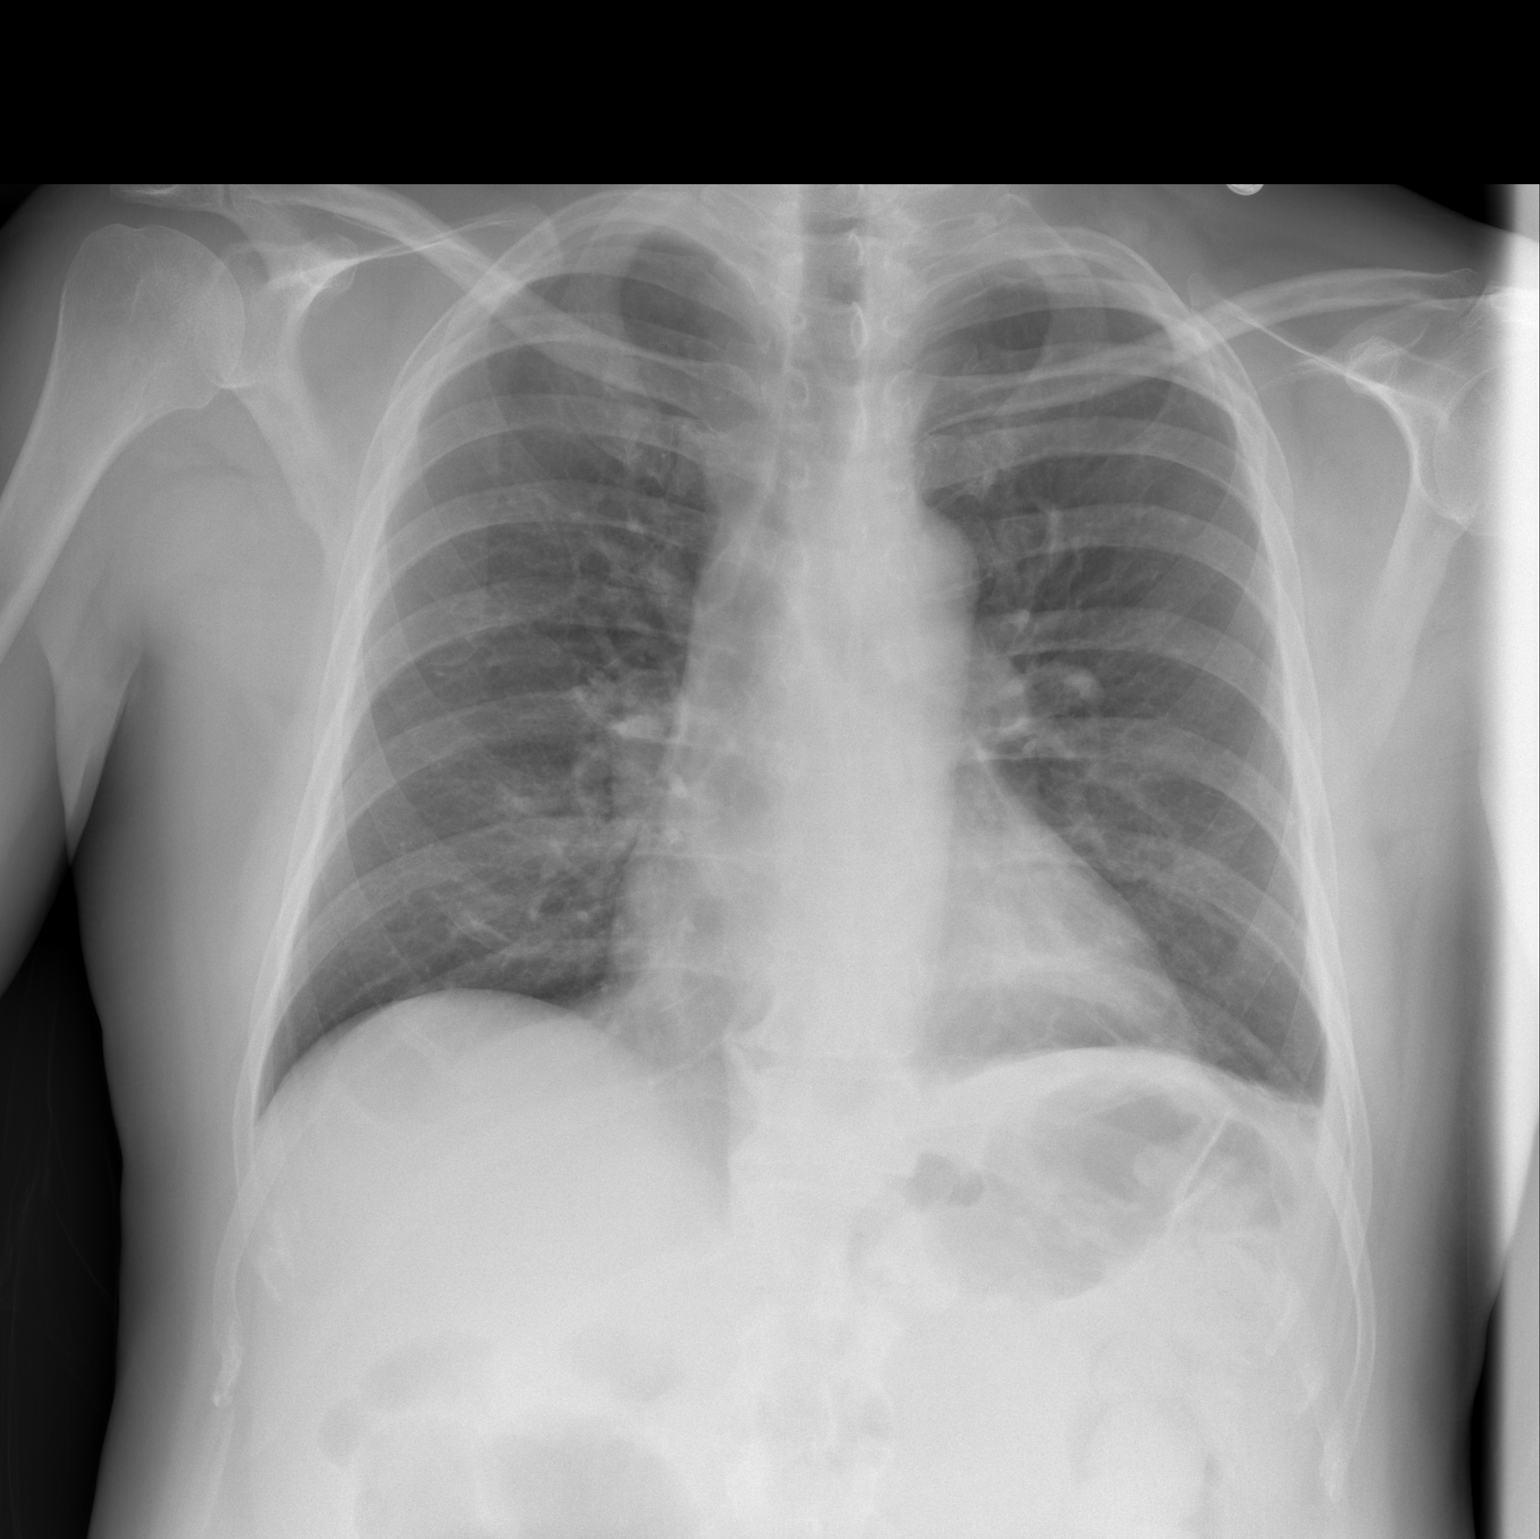

[w chest lat]
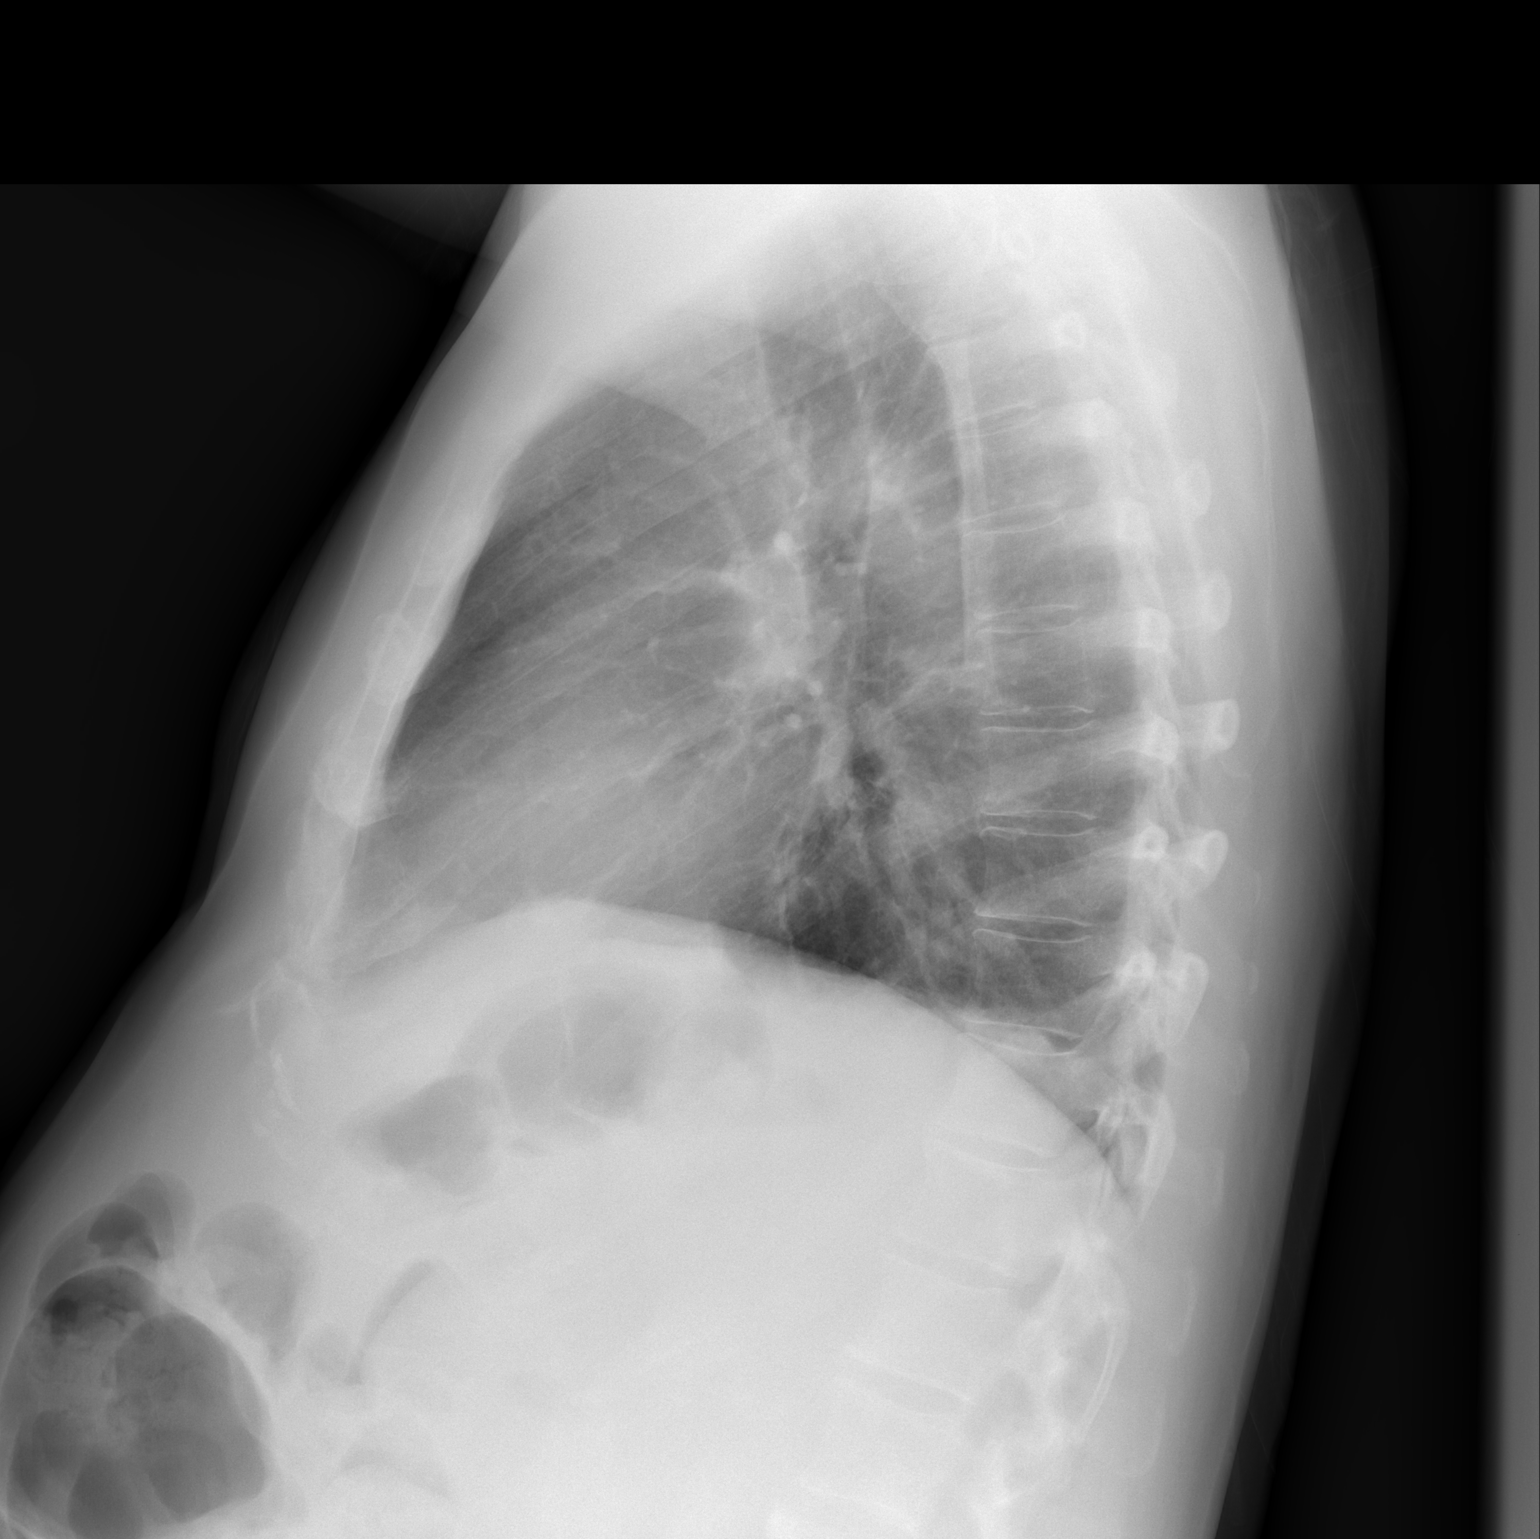

[2 of 2 positions shown; findings below may reference images not displayed]

FINDINGS: Mild blunting of the left costophrenic angle. Lungs appear otherwise
clear. Cardiac and mediastinal margins appear normal.
IMPRESSION: 1. Small left pleural effusion.

## 2016-01-01 ENCOUNTER — Emergency Department (HOSPITAL_BASED_OUTPATIENT_CLINIC_OR_DEPARTMENT_OTHER)
Admission: EM | Admit: 2016-01-01 | Discharge: 2016-01-01 | Disposition: A | Discharge status: Home / Self Care | Payer: Managed Care, Other (non HMO) | Attending: Emergency Medicine | Admitting: Emergency Medicine

## 2016-01-01 ENCOUNTER — Encounter (HOSPITAL_BASED_OUTPATIENT_CLINIC_OR_DEPARTMENT_OTHER): Payer: Self-pay | Admitting: *Deleted

## 2016-01-01 DIAGNOSIS — Z7982 Long term (current) use of aspirin: Secondary | ICD-10-CM | POA: Insufficient documentation

## 2016-01-01 DIAGNOSIS — Z79899 Other long term (current) drug therapy: Secondary | ICD-10-CM | POA: Diagnosis not present

## 2016-01-01 DIAGNOSIS — M1990 Unspecified osteoarthritis, unspecified site: Secondary | ICD-10-CM | POA: Diagnosis not present

## 2016-01-01 DIAGNOSIS — R05 Cough: Secondary | ICD-10-CM | POA: Diagnosis present

## 2016-01-01 DIAGNOSIS — J029 Acute pharyngitis, unspecified: Secondary | ICD-10-CM

## 2016-01-01 DIAGNOSIS — E119 Type 2 diabetes mellitus without complications: Secondary | ICD-10-CM | POA: Diagnosis not present

## 2016-01-01 DIAGNOSIS — Z794 Long term (current) use of insulin: Secondary | ICD-10-CM | POA: Diagnosis not present

## 2016-01-01 DIAGNOSIS — Z87442 Personal history of urinary calculi: Secondary | ICD-10-CM | POA: Insufficient documentation

## 2016-01-01 DIAGNOSIS — J069 Acute upper respiratory infection, unspecified: Secondary | ICD-10-CM | POA: Insufficient documentation

## 2016-01-01 DIAGNOSIS — I10 Essential (primary) hypertension: Secondary | ICD-10-CM | POA: Insufficient documentation

## 2016-01-01 DIAGNOSIS — Z8673 Personal history of transient ischemic attack (TIA), and cerebral infarction without residual deficits: Secondary | ICD-10-CM | POA: Diagnosis not present

## 2016-01-01 LAB — RAPID STREP SCREEN (MED CTR MEBANE ONLY): Streptococcus, Group A Screen (Direct): NEGATIVE

## 2016-01-01 NOTE — Discharge Instructions (Signed)
Pharyngitis Pharyngitis is a sore throat (pharynx). There is redness, pain, and swelling of your throat. HOME CARE   Drink enough fluids to keep your pee (urine) clear or pale yellow.  Only take medicine as told by your doctor.  You may get sick again if you do not take medicine as told. Finish your medicines, even if you start to feel better.  Do not take aspirin.  Rest.  Rinse your mouth (gargle) with salt water ( tsp of salt per 1 qt of water) every 1-2 hours. This will help the pain.  If you are not at risk for choking, you can suck on hard candy or sore throat lozenges. GET HELP IF:  You have large, tender lumps on your neck.  You have a rash.  You cough up green, yellow-brown, or bloody spit. GET HELP RIGHT AWAY IF:   You have a stiff neck.  You drool or cannot swallow liquids.  You throw up (vomit) or are not able to keep medicine or liquids down.  You have very bad pain that does not go away with medicine.  You have problems breathing (not from a stuffy nose). MAKE SURE YOU:   Understand these instructions.  Will watch your condition.  Will get help right away if you are not doing well or get worse.  A rapid strep test was negative. Other symptoms seem to be consistent with upper respiratory infection. Formal culture of the throat will be done you'll be notified if it's positive.   This information is not intended to replace advice given to you by your health care provider. Make sure you discuss any questions you have with your health care provider.     Document Released: 03/28/2008 Document Revised: 07/31/2013 Document Reviewed: 06/17/2013 Elsevier Interactive Patient Education [employer]2016 [employer]

## 2016-01-01 NOTE — ED Notes (Signed)
MD at bedside. 

## 2016-01-01 NOTE — ED Notes (Signed)
D/c home- no new Rx given 

## 2016-01-01 NOTE — ED Provider Notes (Signed)
CSN: [id number]     Arrival date & time 01/01/16  1015 History   First MD Initiated Contact with Patient 01/01/16 1027     Chief Complaint  Patient presents with  . Cough  . Sore Throat     (Consider location/radiation/quality/duration/timing/severity/associated sxs/prior Treatment) Patient is a 70 y.o. male presenting with cough and pharyngitis. The history is provided by the patient.  Cough Associated symptoms: sore throat   Associated symptoms: no fever, no headaches, no myalgias, no rash and no shortness of breath   Sore Throat Pertinent negatives include no abdominal pain, no headaches and no shortness of breath.  Patient with complaint of cough nonproductive congestion sore throat for 4 days. No nausea vomiting or diarrhea no fevers. Patient concerned about having strep throat because his grand shouldered padded. Patient never had strep throat in the past.  Past Medical History  Diagnosis Date  . Hypertension   . Diabetes mellitus     PT ON INSULIN - TYPE II  . Stroke ([facility]) 2012    MINI-STROKE-EXPERIENCE SLIGHT NUMBNESS LEFT SIDE AND ELEVATED B/P--STILL HAS SOME NUMBNESS AND WEAKNESS BUT ABLE TO USE ARM AND LEG  . History of kidney stones     MOST RECENT - 01/06/14 - HAD CYSTO AND STENT PLACEMENT -LEFT  . Arthritis     MINOR - HANDS   Past Surgical History  Procedure Laterality Date  . Cystoscopy with retrograde pyelogram, ureteroscopy and stent placement Left 01/06/2014    Procedure: CYSTOSCOPY WITH  LEFT RETROGRADE PYELOGRAM, AND LEFT STENT PLACEMENT ;  Surgeon: Milford Cageaniel Young Woodruff, MD;  Location: [facility];  Service: Urology;  Laterality: Left;  Marland Kitchen. Eye surgery      LASER BOTH EYES FOR DIABETIC COMPLICATIONS  . 2010 removal of kidney stone / cyst0    . Cystoscopy with retrograde pyelogram, ureteroscopy and stent placement Left 01/28/2014    Procedure: CYSTOSCOPY WITH RETROGRADE PYELOGRAM, URETEROSCOPY AND STENT PLACEMENT;  Surgeon: Milford Cageaniel Young Woodruff, MD;  Location: [facility];  Service: Urology;  Laterality: Left;  LEFT URETER STENT EXCHANGE  LEFT URETER DILATION        . Holmium laser application Left 01/28/2014    Procedure: HOLMIUM LASER APPLICATION;  Surgeon: Milford Cageaniel Young Woodruff, MD;  Location: [facility];  Service: Urology;  Laterality: Left;   No family history on file. Social History  Substance Use Topics  . Smoking status: Never Smoker   . Smokeless tobacco: Never Used  . Alcohol Use: Yes     Comment: occasional    Review of Systems  Constitutional: Negative for fever.  HENT: Positive for congestion and sore throat.   Eyes: Negative for redness.  Respiratory: Positive for cough. Negative for shortness of breath.   Gastrointestinal: Negative for nausea, vomiting, abdominal pain and diarrhea.  Genitourinary: Negative for dysuria.  Musculoskeletal: Negative for myalgias.  Skin: Negative for rash.  Neurological: Negative for headaches.  Hematological: Does not bruise/bleed easily.  Psychiatric/Behavioral: Negative for confusion.      Allergies  Scallops  Home Medications   Prior to Admission medications   Medication Sig Start Date End Date Taking? Authorizing Provider  aspirin EC 325 MG tablet Take 325 mg by mouth daily.   Yes Historical Provider, MD  diltiazem (DILACOR XR) 180 MG 24 hr capsule Take 180 mg by mouth every morning.    Yes Historical Provider, MD  GABAPENTIN PO Take by mouth daily.   Yes Historical Provider, MD  insulin glargine (LANTUS) 100 UNIT/ML injection Inject 30 Units  into the skin at bedtime.    Yes Historical Provider, MD  pioglitazone (ACTOS) 45 MG tablet Take 45 mg by mouth every morning.    Yes Historical Provider, MD  Polyethyl Glycol-Propyl Glycol (SYSTANE) 0.4-0.3 % SOLN Place 1 drop into both eyes 3 (three) times daily as needed (dry eyes).   Yes Historical Provider, MD  cephALEXin (KEFLEX) 500 MG capsule Take 1 capsule (500 mg total) by mouth 3 (three) times daily. Start on Thursday, 01/30/14. 01/28/14    Natalia Leatherwood, MD  oxybutynin (DITROPAN) 5 MG tablet Take 1 tablet (5 mg total) by mouth every 6 (six) hours as needed for bladder spasms. 01/06/14   Natalia Leatherwood, MD  oxyCODONE-acetaminophen (PERCOCET) 5-325 MG per tablet Take 1-2 tablets by mouth every 4 (four) hours as needed. 01/28/14   Natalia Leatherwood, MD  phenazopyridine (PYRIDIUM) 100 MG tablet Take 1 tablet (100 mg total) by mouth every 8 (eight) hours as needed for pain (Burning urination.  Will turn urine and body fluids orange.). 01/06/14   Natalia Leatherwood, MD  ramipril (ALTACE) 10 MG capsule Take 10 mg by mouth every morning.     Historical Provider, MD  rosuvastatin (CRESTOR) 20 MG tablet Take 20 mg by mouth every morning.     Historical Provider, MD  senna-docusate (SENOKOT S) 8.6-50 MG per tablet Take 1 tablet by mouth 2 (two) times daily. 01/06/14   Natalia Leatherwood, MD  tamsulosin (FLOMAX) 0.4 MG CAPS capsule Take 1 capsule (0.4 mg total) by mouth at bedtime. 01/06/14   Natalia Leatherwood, MD   BP 171/80 mmHg  Pulse 74  Temp(Src) 99.2 F (37.3 C) (Oral)  Resp 20  Ht  (1.727 m)  Wt 81.647 kg  BMI 27.38 kg/m2  SpO2 98% Physical Exam  Constitutional: He is oriented to person, place, and time. He appears well-developed and well-nourished. No distress.  HENT:  Head: Normocephalic and atraumatic.  Mouth/Throat: Oropharynx is clear and moist. No oropharyngeal exudate.  Eyes: Conjunctivae and EOM are normal. Pupils are equal, round, and reactive to light.  Neck: Normal range of motion. Neck supple.  Cardiovascular: Normal rate, regular rhythm and normal heart sounds.   No murmur heard. Pulmonary/Chest: Effort normal and breath sounds normal. No respiratory distress.  Abdominal: Soft. Bowel sounds are normal. There is no tenderness.  Musculoskeletal: Normal range of motion.  Neurological: He is alert and oriented to person, place, and time. No cranial nerve deficit. He exhibits normal muscle tone. Coordination normal.  Skin:  Skin is warm. No rash noted.  Nursing note and vitals reviewed.   ED Course  Procedures (including critical care time) Labs Review Labs Reviewed  RAPID STREP SCREEN (NOT AT [facility])  CULTURE, GROUP A STREP [facility])    Imaging Review No results found. I have personally reviewed and evaluated these images and lab results as part of my medical decision-making.   EKG Interpretation None      MDM   Final diagnoses:  URI (upper respiratory infection)  Pharyngitis    Patient nontoxic no acute distress. Rapid strep is negative. Followed up by throat culture. Patient's other symptoms are consistent with an upper respiratory infection. Patient has a history of hypertension so we treated just with Mucinex. He is oriented but that over-the-counter. Patient return for any new or worse symptoms.    Vanetta Mulders, MD 01/01/16 1128

## 2016-01-01 NOTE — ED Notes (Signed)
Cough, congestion, runny nose, sore throat x 4 days. No known fever

## 2016-01-03 LAB — CULTURE, GROUP A STREP (THRC)

## 2016-01-05 IMAGING — CR DG FINGER INDEX 2+V*R*
3 series · 3 of 3 positions shown · non-contrast
Comparison: No priors.

CLINICAL DATA: Laceration to the right index finger a week ago,
currently having pain.

EXAM:
RIGHT INDEX FINGER 2+V

[x finger pa right]
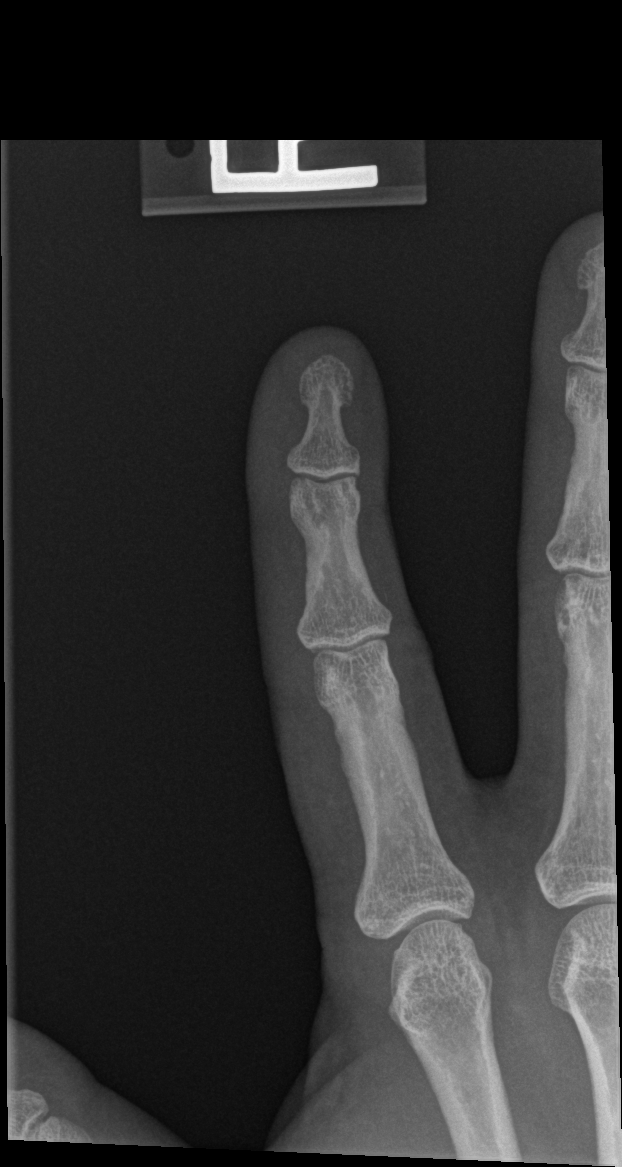

[x finger obl. right]
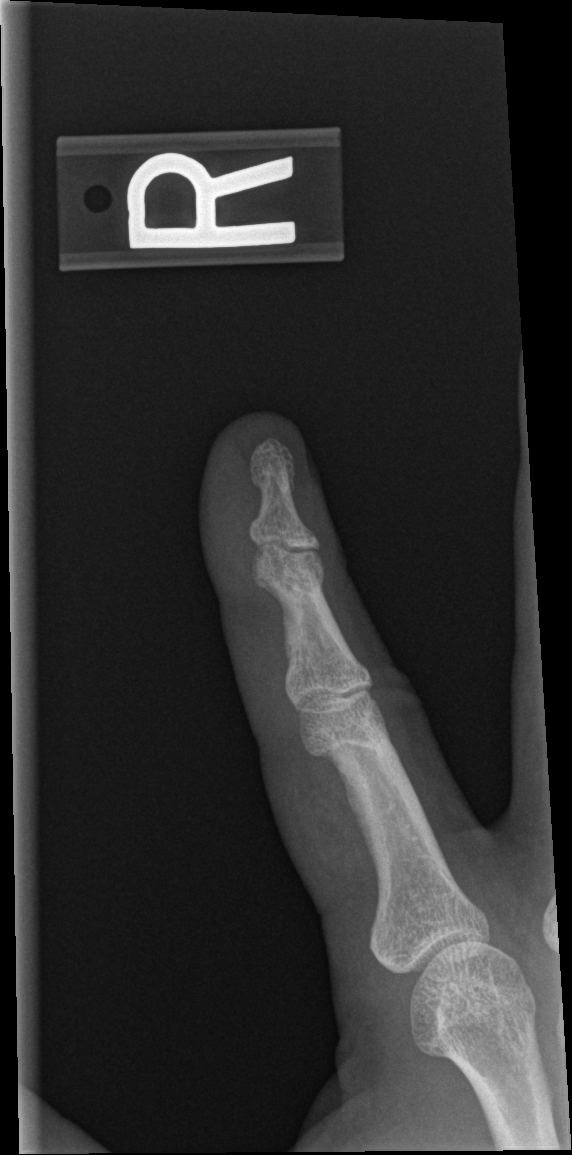

[x finger lateral right]
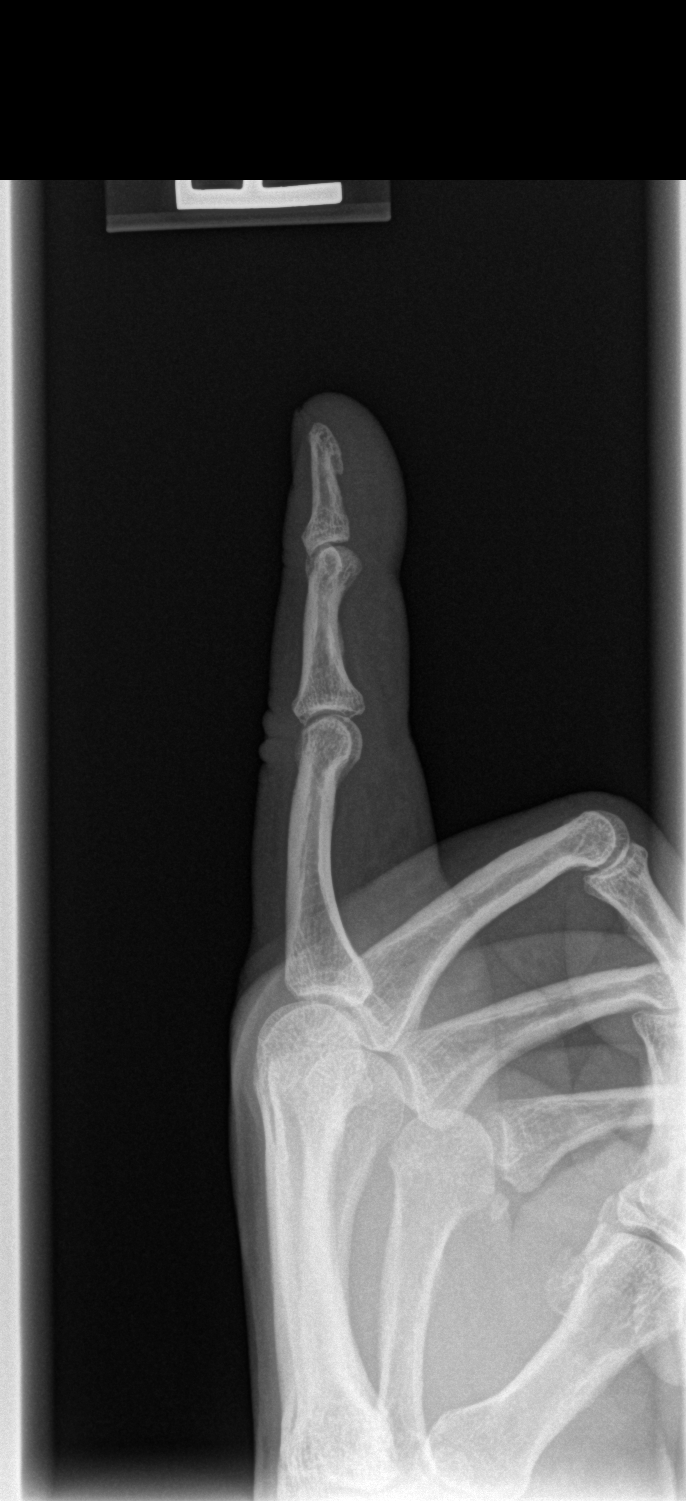

[3 of 3 positions shown; findings below may reference images not displayed]

FINDINGS: Three views of the right second finger demonstrate no acute
displaced fracture, subluxation, dislocation, joint or soft tissue
abnormality. Specifically, no retained radiopaque foreign body
within the soft tissues.
IMPRESSION: 1. No acute radiographic abnormality of the right finger.

## 2024-02-13 NOTE — Progress Notes (Signed)
 COVID Vaccine Completed:  Date of COVID positive in last 90 days:  PCP - Paulita Boss, MD Cardiologist -  Endocrinologist- Katina Parlor, MD  Chest x-ray -  EKG -  Stress Test -  ECHO -  Cardiac Cath -  Pacemaker/ICD device last checked: Spinal Cord Stimulator:  Bowel Prep -   Sleep Study -  CPAP -   Fasting Blood Sugar -  Checks Blood Sugar _____ times a day  Last dose of GLP1 agonist-  N/A GLP1 instructions:  Hold 7 days before surgery    Last dose of SGLT-2 inhibitors-  N/A SGLT-2 instructions:  Hold 3 days before surgery    Blood Thinner Instructions:  Last dose:   Time: Aspirin Instructions: Last Dose:  Activity level:  Can go up a flight of stairs and perform activities of daily living without stopping and without symptoms of chest pain or shortness of breath.  Able to exercise without symptoms  Unable to go up a flight of stairs without symptoms of     Anesthesia review:   Patient denies shortness of breath, fever, cough and chest pain at PAT appointment  Patient verbalized understanding of instructions that were given to them at the PAT appointment. Patient was also instructed that they will need to review over the PAT instructions again at home before surgery.

## 2024-02-13 NOTE — Patient Instructions (Signed)
 SURGICAL WAITING ROOM VISITATION  Patients having surgery or a procedure may have no more than 2 support people in the waiting area - these visitors may rotate.    Children under the age of 27 must have an adult with them who is not the patient.  Due to an increase in RSV and influenza rates and associated hospitalizations, children ages 35 and under may not visit patients in [facility] hospitals.  Visitors with respiratory illnesses are discouraged from visiting and should remain at home.  If the patient needs to stay at the hospital during part of their recovery, the visitor guidelines for inpatient rooms apply. Pre-op nurse will coordinate an appropriate time for 1 support person to accompany patient in pre-op.  This support person may not rotate.    Please refer to the [facility] website for the visitor guidelines for Inpatients (after your surgery is over and you are in a regular room).    Your procedure is scheduled on: 02/27/24   Report to [facility] Main Entrance    Report to admitting at 7:30 AM   Call this number if you have problems the morning of surgery [phone]   Do not eat food :After Midnight.   After Midnight you may have the following liquids until 7:00 AM DAY OF SURGERY  Water Non-Citrus Juices (without pulp, NO RED-Apple, White grape, White cranberry) Black Coffee (NO MILK/CREAM OR CREAMERS, sugar ok)  Clear Tea (NO MILK/CREAM OR CREAMERS, sugar ok) regular and decaf                             Plain Jell-O (NO RED)                                           Fruit ices (not with fruit pulp, NO RED)                                     Popsicles (NO RED)                                                               Sports drinks like Gatorade (NO RED)     The day of surgery:  Drink ONE (1) Pre-Surgery G2 at 7:00 AM the morning of surgery. Drink in one sitting. Do not sip.  This drink was given to you during your hospital  pre-op appointment  visit. Nothing else to drink after completing the  Pre-Surgery G2.          If you have questions, please contact your surgeon's office.   FOLLOW BOWEL PREP AND ANY ADDITIONAL PRE OP INSTRUCTIONS YOU RECEIVED FROM YOUR SURGEON'S OFFICE!!!     Oral Hygiene is also important to reduce your risk of infection.                                    Remember - BRUSH YOUR TEETH THE MORNING OF SURGERY WITH YOUR REGULAR TOOTHPASTE  DENTURES WILL  BE REMOVED PRIOR TO SURGERY PLEASE DO NOT APPLY "Poly grip" OR ADHESIVES!!!   Stop all vitamins and herbal supplements 7 days before surgery.   Take these medicines the morning of surgery with A SIP OF WATER: Diltiazem, Gabapentin   DO NOT TAKE ANY ORAL DIABETIC MEDICATIONS DAY OF YOUR SURGERY  How to Manage Your Diabetes Before and After Surgery  Why is it important to control my blood sugar before and after surgery? Improving blood sugar levels before and after surgery helps healing and can limit problems. A way of improving blood sugar control is eating a healthy diet by:  Eating less sugar and carbohydrates  Increasing activity/exercise  Talking with your doctor about reaching your blood sugar goals High blood sugars (greater than 180 mg/dL) can raise your risk of infections and slow your recovery, so you will need to focus on controlling your diabetes during the weeks before surgery. Make sure that the doctor who takes care of your diabetes knows about your planned surgery including the date and location.  How do I manage my blood sugar before surgery? Check your blood sugar at least 4 times a day, starting 2 days before surgery, to make sure that the level is not too high or low. Check your blood sugar the morning of your surgery when you wake up and every 2 hours until you get to the Short Stay unit. If your blood sugar is less than 70 mg/dL, you will need to treat for low blood sugar: Do not take insulin. Treat a low blood sugar (less than  70 mg/dL) with  cup of clear juice (cranberry or apple), 4 glucose tablets, OR glucose gel. Recheck blood sugar in 15 minutes after treatment (to make sure it is greater than 70 mg/dL). If your blood sugar is not greater than 70 mg/dL on recheck, call [phone] for further instructions. Report your blood sugar to the short stay nurse when you get to Short Stay.  If you are admitted to the hospital after surgery: Your blood sugar will be checked by the staff and you will probably be given insulin after surgery (instead of oral diabetes medicines) to make sure you have good blood sugar levels. The goal for blood sugar control after surgery is 80-180 mg/dL.   WHAT DO I DO ABOUT MY DIABETES MEDICATION?  Do not take oral diabetes medicines (pills) the morning of surgery.  THE DAY BEFORE SURGERY, take 50% of Degludec insulin. Take ACTOS as prescribed.     THE MORNING OF SURGERY, do not take Degludec insulin or ACTOS.  Reviewed and Endorsed by [facility] Patient Education Committee, August 2015  Bring CPAP mask and tubing day of surgery.                              You may not have any metal on your body including jewelry, and body piercing             Do not wear lotions, powders, cologne, or deodorant              Men may shave face and neck.   Do not bring valuables to the hospital. [facility] IS NOT             RESPONSIBLE   FOR VALUABLES.   Contacts, glasses, dentures or bridgework may not be worn into surgery.  DO NOT BRING YOUR HOME MEDICATIONS TO THE HOSPITAL. PHARMACY WILL DISPENSE MEDICATIONS  LISTED ON YOUR MEDICATION LIST TO YOU DURING YOUR ADMISSION IN THE HOSPITAL!    Patients discharged on the day of surgery will not be allowed to drive home.  Someone NEEDS to stay with you for the first 24 hours after anesthesia.   Special Instructions: Bring a copy of your healthcare power of attorney and living will documents the day of surgery if you haven't scanned them  before.              Please read over the following fact sheets you were given: IF YOU HAVE QUESTIONS ABOUT YOUR PRE-OP INSTRUCTIONS PLEASE CALL [phone] Party    If you received a COVID test during your pre-op visit  it is requested that you wear a mask when out in public, stay away from anyone that may not be feeling well and notify your surgeon if you develop symptoms. If you test positive for Covid or have been in contact with anyone that has tested positive in the last 10 days please notify you surgeon.      Pre-operative 5 CHG Bath Instructions   You can play a key role in reducing the risk of infection after surgery. Your skin needs to be as free of germs as possible. You can reduce the number of germs on your skin by washing with CHG (chlorhexidine  gluconate) soap before surgery. CHG is an antiseptic soap that kills germs and continues to kill germs even after washing.   DO NOT use if you have an allergy to chlorhexidine /CHG or antibacterial soaps. If your skin becomes reddened or irritated, stop using the CHG and notify one of our RNs at [phone].   Please shower with the CHG soap starting 4 days before surgery using the following schedule:     Please keep in mind the following:  DO NOT shave, including legs and underarms, starting the day of your first shower.   You may shave your face at any point before/day of surgery.  Place clean sheets on your bed the day you start using CHG soap. Use a clean washcloth (not used since being washed) for each shower. DO NOT sleep with pets once you start using the CHG.   CHG Shower Instructions:  If you choose to wash your hair and private area, wash first with your normal shampoo/soap.  After you use shampoo/soap, rinse your hair and body thoroughly to remove shampoo/soap residue.  Turn the water OFF and apply about 3 tablespoons (45 ml) of CHG soap to a CLEAN washcloth.  Apply CHG soap ONLY FROM YOUR NECK DOWN TO YOUR TOES  (washing for 3-5 minutes)  DO NOT use CHG soap on face, private areas, open wounds, or sores.  Pay special attention to the area where your surgery is being performed.  If you are having back surgery, having someone wash your back for you may be helpful. Wait 2 minutes after CHG soap is applied, then you may rinse off the CHG soap.  Pat dry with a clean towel  Put on clean clothes/pajamas   If you choose to wear lotion, please use ONLY the CHG-compatible lotions on the back of this paper.     Additional instructions for the day of surgery: DO NOT APPLY any lotions, deodorants, cologne, or perfumes.   Put on clean/comfortable clothes.  Brush your teeth.  Ask your nurse before applying any prescription medications to the skin.      CHG Compatible Lotions   Aveeno Moisturizing lotion  Cetaphil Moisturizing Cream  Cetaphil Moisturizing Lotion  Clairol Herbal Essence Moisturizing Lotion, Dry Skin  Clairol Herbal Essence Moisturizing Lotion, Extra Dry Skin  Clairol Herbal Essence Moisturizing Lotion, Normal Skin  Curel Age Defying Therapeutic Moisturizing Lotion with Alpha Hydroxy  Curel Extreme Care Body Lotion  Curel Soothing Hands Moisturizing Hand Lotion  Curel Therapeutic Moisturizing Cream, Fragrance-Free  Curel Therapeutic Moisturizing Lotion, Fragrance-Free  Curel Therapeutic Moisturizing Lotion, Original Formula  Eucerin Daily Replenishing Lotion  Eucerin Dry Skin Therapy Plus Alpha Hydroxy Crme  Eucerin Dry Skin Therapy Plus Alpha Hydroxy Lotion  Eucerin Original Crme  Eucerin Original Lotion  Eucerin Plus Crme Eucerin Plus Lotion  Eucerin TriLipid Replenishing Lotion  Keri Anti-Bacterial Hand Lotion  Keri Deep Conditioning Original Lotion Dry Skin Formula Softly Scented  Keri Deep Conditioning Original Lotion, Fragrance Free Sensitive Skin Formula  Keri Lotion Fast Absorbing Fragrance Free Sensitive Skin Formula  Keri Lotion Fast Absorbing Softly Scented Dry Skin  Formula  Keri Original Lotion  Keri Skin Renewal Lotion Keri Silky Smooth Lotion  Keri Silky Smooth Sensitive Skin Lotion  Nivea Body Creamy Conditioning Oil  Nivea Body Extra Enriched Lotion  Nivea Body Original Lotion  Nivea Body Sheer Moisturizing Lotion Nivea Crme  Nivea Skin Firming Lotion  NutraDerm 30 Skin Lotion  NutraDerm Skin Lotion  NutraDerm Therapeutic Skin Cream  NutraDerm Therapeutic Skin Lotion  ProShield Protective Hand Cream  Provon moisturizing lotion   Incentive Spirometer  An incentive spirometer is a tool that can help keep your lungs clear and active. This tool measures how well you are filling your lungs with each breath. Taking long deep breaths may help reverse or decrease the chance of developing breathing (pulmonary) problems (especially infection) following: A long period of time when you are unable to move or be active. BEFORE THE PROCEDURE  If the spirometer includes an indicator to show your best effort, your nurse or respiratory therapist will set it to a desired goal. If possible, sit up straight or lean slightly forward. Try not to slouch. Hold the incentive spirometer in an upright position. INSTRUCTIONS FOR USE  Sit on the edge of your bed if possible, or sit up as far as you can in bed or on a chair. Hold the incentive spirometer in an upright position. Breathe out normally. Place the mouthpiece in your mouth and seal your lips tightly around it. Breathe in slowly and as deeply as possible, raising the piston or the ball toward the top of the column. Hold your breath for 3-5 seconds or for as long as possible. Allow the piston or ball to fall to the bottom of the column. Remove the mouthpiece from your mouth and breathe out normally. Rest for a few seconds and repeat Steps 1 through 7 at least 10 times every 1-2 hours when you are awake. Take your time and take a few normal breaths between deep breaths. The spirometer may include an indicator to  show your best effort. Use the indicator as a goal to work toward during each repetition. After each set of 10 deep breaths, practice coughing to be sure your lungs are clear. If you have an incision (the cut made at the time of surgery), support your incision when coughing by placing a pillow or rolled up towels firmly against it. Once you are able to get out of bed, walk around indoors and cough well. You may stop using the incentive spirometer when instructed by your caregiver.  RISKS AND COMPLICATIONS Take your time so you do not  get dizzy or light-headed. If you are in pain, you may need to take or ask for pain medication before doing incentive spirometry. It is harder to take a deep breath if you are having pain. AFTER USE Rest and breathe slowly and easily. It can be helpful to keep track of a log of your progress. Your caregiver can provide you with a simple table to help with this. If you are using the spirometer at home, follow these instructions: SEEK MEDICAL CARE IF:  You are having difficultly using the spirometer. You have trouble using the spirometer as often as instructed. Your pain medication is not giving enough relief while using the spirometer. You develop fever of 100.5 F (38.1 C) or higher. SEEK IMMEDIATE MEDICAL CARE IF:  You cough up bloody sputum that had not been present before. You develop fever of 102 F (38.9 C) or greater. You develop worsening pain at or near the incision site. MAKE SURE YOU:  Understand these instructions. Will watch your condition. Will get help right away if you are not doing well or get worse. Document Released: 02/20/2007 Document Revised: 01/02/2012 Document Reviewed: 04/23/2007 [facility] Patient Information 2014 [place], [place].   ________________________________________________________________________

## 2024-02-14 ENCOUNTER — Encounter (HOSPITAL_COMMUNITY): Payer: Self-pay

## 2024-02-14 ENCOUNTER — Other Ambulatory Visit: Payer: Self-pay

## 2024-02-14 ENCOUNTER — Encounter (HOSPITAL_COMMUNITY)
Admission: RE | Admit: 2024-02-14 | Discharge: 2024-02-14 | Disposition: A | Discharge status: Home / Self Care | Source: Ambulatory Visit | Attending: Orthopedic Surgery | Admitting: Orthopedic Surgery

## 2024-02-14 VITALS — BP 181/81 | HR 72 | Temp 98.1°F | Resp 18 | Ht 68.0 in | Wt 199.0 lb

## 2024-02-14 DIAGNOSIS — Z794 Long term (current) use of insulin: Secondary | ICD-10-CM | POA: Insufficient documentation

## 2024-02-14 DIAGNOSIS — E119 Type 2 diabetes mellitus without complications: Secondary | ICD-10-CM | POA: Diagnosis not present

## 2024-02-14 DIAGNOSIS — Z01818 Encounter for other preprocedural examination: Secondary | ICD-10-CM | POA: Insufficient documentation

## 2024-02-14 LAB — CBC
HCT: 42.7 % (ref 39.0–52.0)
Hemoglobin: 13.8 g/dL (ref 13.0–17.0)
MCH: 30.4 pg (ref 26.0–34.0)
MCHC: 32.3 g/dL (ref 30.0–36.0)
MCV: 94.1 fL (ref 80.0–100.0)
Platelets: 248 10*3/uL (ref 150–400)
RBC: 4.54 MIL/uL (ref 4.22–5.81)
RDW: 14.6 % (ref 11.5–15.5)
WBC: 9 10*3/uL (ref 4.0–10.5)
nRBC: 0 % (ref 0.0–0.2)

## 2024-02-14 LAB — SURGICAL PCR SCREEN
MRSA, PCR: NEGATIVE
Staphylococcus aureus: NEGATIVE

## 2024-02-14 LAB — BASIC METABOLIC PANEL WITH GFR
Anion gap: 10 (ref 5–15)
BUN: 25 mg/dL — ABNORMAL HIGH (ref 8–23)
CO2: 26 mmol/L (ref 22–32)
Calcium: 9.4 mg/dL (ref 8.9–10.3)
Chloride: 105 mmol/L (ref 98–111)
Creatinine, Ser: 2.31 mg/dL — ABNORMAL HIGH (ref 0.61–1.24)
GFR, Estimated: 28 mL/min — ABNORMAL LOW (ref >60)
Glucose, Bld: 119 mg/dL — ABNORMAL HIGH (ref 70–99)
Potassium: 4.6 mmol/L (ref 3.5–5.1)
Sodium: 141 mmol/L (ref 135–145)

## 2024-02-14 LAB — GLUCOSE, CAPILLARY: Glucose-Capillary: 109 mg/dL — ABNORMAL HIGH (ref 70–99)

## 2024-02-14 LAB — HEMOGLOBIN A1C
Hgb A1c MFr Bld: 8.7 % — ABNORMAL HIGH (ref 4.8–5.6)
Mean Plasma Glucose: 202.99 mg/dL

## 2024-02-14 NOTE — Progress Notes (Signed)
 Creatinine 2.13 results routed to Dr. Bernard Brick

## 2024-02-15 ENCOUNTER — Encounter (HOSPITAL_COMMUNITY): Payer: Self-pay | Admitting: Physician Assistant

## 2024-02-27 ENCOUNTER — Ambulatory Visit (HOSPITAL_COMMUNITY): Admission: RE | Admit: 2024-02-27 | Source: Ambulatory Visit | Admitting: Orthopedic Surgery

## 2024-02-27 ENCOUNTER — Encounter (HOSPITAL_COMMUNITY): Admission: RE | Payer: Self-pay | Source: Ambulatory Visit

## 2024-02-27 SURGERY — ARTHROPLASTY, KNEE, TOTAL
Anesthesia: Spinal | Site: Knee | Laterality: Right

## 2024-04-20 NOTE — Progress Notes (Signed)
 COVID Vaccine received:  []  No [x]  Yes Date of any COVID positive Test in last 90 days:  none  PCP / Endocrinology - Elsie Sharps, MD at [facility]  [phone] (Work)  [phone] (Fax)  Cardiologist -  none  Chest x-ray - 01-20-2014  2v  Epic EKG - 02-14-2024  Epic  Stress Test -  ECHO -  Cardiac Cath -  CT Coronary Calcium score:   Pacemaker / ICD device [x]  No []  Yes   Spinal Cord Stimulator:[x]  No []  Yes       History of Sleep Apnea? [x]  No []  Yes   CPAP used?- [x]  No []  Yes    Does the patient monitor blood sugar?   []  N/A   []  No [x]  Yes  Patient has: []  NO Hx DM   []  Pre-DM   []  DM1  [x]   DM2 Last A1c was: 6.8 on 04-18-24      Does patient have a [employer] or Dexcom? [x]  No []  Yes   Fasting Blood Sugar Ranges- 80-140 Checks Blood Sugar _2 times a day  Blood Thinner / Instructions:  none Aspirin Instructions:  ASA 81  hold x 7 days  patient is aware  ERAS Protocol Ordered: []  No  [x]  Yes PRE-SURGERY []  ENSURE  [x]  G2   Patient is to be NPO after: 0700  Dental hx: []  Dentures:  [x]  N/A      []  Bridge or Partial:                   []  Loose or Damaged teeth:   Comments: Patient was given the 5 CHG shower / bath instructions for TKA  surgery along with 2 bottles of the CHG soap. Patient will start this on:   04-26-24           Activity level: Able to walk up 2 flights of stairs without becoming significantly short of breath or having chest pain?  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  Patient verbalized understanding and agreement to the Pre-Surgical Instructions that were given to them at this PAT appointment. Patient was also educated of the need to review these PAT instructions again prior to his surgery.I reviewed the appropriate phone numbers to call if they have any and questions or concerns.

## 2024-04-20 NOTE — Patient Instructions (Addendum)
 SURGICAL WAITING ROOM VISITATION Patients having surgery or a procedure may have no more than 2 support people in the waiting area - these visitors may rotate in the visitor waiting room.   If the patient needs to stay at the hospital during part of their recovery, the visitor guidelines for inpatient rooms apply.  PRE-OP VISITATION  Pre-op nurse will coordinate an appropriate time for 1 support person to accompany the patient in pre-op.  This support person may not rotate.  This visitor will be contacted when the time is appropriate for the visitor to come back in the pre-op area.  Please refer to the [facility] website for the visitor guidelines for Inpatients (after your surgery is over and you are in a regular room).  You are not required to quarantine at this time prior to your surgery. However, you must do this: Hand Hygiene often Do NOT share personal items Notify your provider if you are in close contact with someone who has COVID or you develop fever 100.4 or greater, new onset of sneezing, cough, sore throat, shortness of breath or body aches.  If you test positive for Covid or have been in contact with anyone that has tested positive in the last 10 days please notify you surgeon.    Your procedure is scheduled on:  TUESDAY  April 30, 2024  Report to [facility] Main Entrance: [address] entrance where the [employer] is available.   Report to admitting at: 07:30 AM  Call this number if you have any questions or problems the morning of surgery [phone]  How to Manage Your Diabetes Before and After Surgery  Why is it important to control my blood sugar before and after surgery? Improving blood sugar levels before and after surgery helps healing and can limit problems. A way of improving blood sugar control is eating a healthy diet by:  Eating less sugar and carbohydrates  Increasing activity/exercise  Talking with your doctor about reaching your blood sugar  goals High blood sugars (greater than 180 mg/dL) can raise your risk of infections and slow your recovery, so you will need to focus on controlling your diabetes during the weeks before surgery. Make sure that the doctor who takes care of your diabetes knows about your planned surgery including the date and location.  How do I manage my blood sugar before surgery? Check your blood sugar at least 4 times a day, starting 2 days before surgery, to make sure that the level is not too high or low. Check your blood sugar the morning of your surgery when you wake up and every 2 hours until you get to the Short Stay unit. If your blood sugar is less than 70 mg/dL, you will need to treat for low blood sugar: Do not take insulin. Treat a low blood sugar (less than 70 mg/dL) with  cup of clear juice (cranberry or apple), 4 glucose tablets, OR glucose gel. Recheck blood sugar in 15 minutes after treatment (to make sure it is greater than 70 mg/dL). If your blood sugar is not greater than 70 mg/dL on recheck, call [phone] for further instructions. Report your blood sugar to the short stay nurse when you get to Short Stay.  If you are admitted to the hospital after surgery: Your blood sugar will be checked by the staff and you will probably be given insulin after surgery (instead of oral diabetes medicines) to make sure you have good blood sugar levels. The goal for blood  sugar control after surgery is 80-180 mg/dL.   WHAT DO I DO ABOUT MY DIABETES MEDICATION?  Pioglitazone (Actos)- Day before surgery take as usual. DAY OF SURGERY: DO NOT TAKE.   Insulin Degludec Lelon)- Day before Surgery take 50 % of usual dose (50 units).      IF you have any questions, call the nurse at [phone]  Do not eat food after Midnight the night prior to your surgery/procedure.  After Midnight you may have the following liquids until 07:00 AM  DAY OF SURGERY  Clear Liquid Diet Water Black Coffee (sugar ok,  NO MILK/CREAM OR CREAMERS)  Tea (sugar ok, NO MILK/CREAM OR CREAMERS) regular and decaf                             Plain Jell-O  with no fruit (NO RED)                                           Fruit ices (not with fruit pulp, NO RED)                                     Popsicles (NO RED)                                                                  Juice: NO CITRUS JUICES: only apple, WHITE grape, WHITE cranberry Sports drinks like Gatorade or Powerade (NO RED)                   The day of surgery:  Drink ONE (1) Pre-Surgery G2 at  07:00 AM the morning of surgery. Drink in one sitting. Do not sip.  This drink was given to you during your hospital pre-op appointment visit. Nothing else to drink after completing the Pre-Surgery G2 : No candy, chewing gum or throat lozenges.    FOLLOW ANY ADDITIONAL PRE OP INSTRUCTIONS YOU RECEIVED FROM YOUR SURGEON'S OFFICE!!!   Oral Hygiene is also important to reduce your risk of infection.        Remember - BRUSH YOUR TEETH THE MORNING OF SURGERY WITH YOUR REGULAR TOOTHPASTE  Do NOT smoke after Midnight the night before surgery.  STOP TAKING all Vitamins, Herbs and supplements 1 week before your surgery.   ASPIRIN- Stop taking 5-7 days before your surgery.  Take ONLY these medicines the morning of surgery with A SIP OF WATER: Diltiazem, Gabapentin and Tylenol  if needed for pain. You may use your Eye Drops if needed.                   You may not have any metal on your body including jewelry, and body piercing  Do not wear  lotions, powders, cologne, or deodorant  Men may shave face and neck.  Contacts, Hearing Aids, dentures or bridgework may not be worn into surgery. DENTURES WILL BE REMOVED PRIOR TO SURGERY PLEASE DO NOT APPLY Poly grip OR ADHESIVES!!!  Patients discharged on the day of surgery will not be allowed to drive  home.  Someone NEEDS to stay with you for the first 24 hours after anesthesia.  Do not bring your home  medications to the hospital. The Pharmacy will dispense medications listed on your medication list to you during your admission in the Hospital.  Please read over the following fact sheets you were given: IF YOU HAVE QUESTIONS ABOUT YOUR PRE-OP INSTRUCTIONS, PLEASE CALL [phone].     Pre-operative 5 CHG Bath Instructions   You can play a key role in reducing the risk of infection after surgery. Your skin needs to be as free of germs as possible. You can reduce the number of germs on your skin by washing with CHG (chlorhexidine  gluconate) soap before surgery. CHG is an antiseptic soap that kills germs and continues to kill germs even after washing.   DO NOT use if you have an allergy to chlorhexidine /CHG or antibacterial soaps. If your skin becomes reddened or irritated, stop using the CHG and notify one of our RNs at [phone]  Please shower with the CHG soap starting 4 days before surgery using the following schedule: START SHOWERS ON FRIDAY  April 26, 2024                                                                                                                                                                              Please keep in mind the following:  DO NOT shave, including legs and underarms, starting the day of your first shower.   You may shave your face at any point before/day of surgery.   Place clean sheets on your bed the day you start using CHG soap. Use a clean washcloth (not used since being washed) for each shower. DO NOT sleep with pets once you start using the CHG.   CHG Shower Instructions:  If you choose to wash your hair and private area, wash first with your normal shampoo/soap.  After you use shampoo/soap, rinse your hair and body thoroughly to remove shampoo/soap residue.  Turn the water OFF and apply about 3 tablespoons (45 ml) of CHG soap to a CLEAN washcloth.  Apply CHG soap ONLY FROM YOUR NECK DOWN TO YOUR TOES (washing for 3-5 minutes)  DO NOT  use CHG soap on face, private areas, open wounds, or sores.  Pay special attention to the area where your surgery is being performed.  If you are having back surgery, having someone wash your back for you may be helpful.  Wait 2 minutes after CHG soap is applied, then you may rinse off the CHG soap.  Pat dry with a clean towel  Put on clean clothes/pajamas   If you choose to wear lotion, please use ONLY the CHG-compatible lotions  on the back of this paper.     Additional instructions for the day of surgery: DO NOT APPLY any lotions, deodorants, cologne, or perfumes.   Put on clean/comfortable clothes.  Brush your teeth.  Ask your nurse before applying any prescription medications to the skin.      CHG Compatible Lotions   Aveeno Moisturizing lotion  Cetaphil Moisturizing Cream  Cetaphil Moisturizing Lotion  Clairol Herbal Essence Moisturizing Lotion, Dry Skin  Clairol Herbal Essence Moisturizing Lotion, Extra Dry Skin  Clairol Herbal Essence Moisturizing Lotion, Normal Skin  Curel Age Defying Therapeutic Moisturizing Lotion with Alpha Hydroxy  Curel Extreme Care Body Lotion  Curel Soothing Hands Moisturizing Hand Lotion  Curel Therapeutic Moisturizing Cream, Fragrance-Free  Curel Therapeutic Moisturizing Lotion, Fragrance-Free  Curel Therapeutic Moisturizing Lotion, Original Formula  Eucerin Daily Replenishing Lotion  Eucerin Dry Skin Therapy Plus Alpha Hydroxy Crme  Eucerin Dry Skin Therapy Plus Alpha Hydroxy Lotion  Eucerin Original Crme  Eucerin Original Lotion  Eucerin Plus Crme Eucerin Plus Lotion  Eucerin TriLipid Replenishing Lotion  Keri Anti-Bacterial Hand Lotion  Keri Deep Conditioning Original Lotion Dry Skin Formula Softly Scented  Keri Deep Conditioning Original Lotion, Fragrance Free Sensitive Skin Formula  Keri Lotion Fast Absorbing Fragrance Free Sensitive Skin Formula  Keri Lotion Fast Absorbing Softly Scented Dry Skin Formula  Keri Original Lotion   Keri Skin Renewal Lotion Keri Silky Smooth Lotion  Keri Silky Smooth Sensitive Skin Lotion  Nivea Body Creamy Conditioning Oil  Nivea Body Extra Enriched Lotion  Nivea Body Original Lotion  Nivea Body Sheer Moisturizing Lotion Nivea Crme  Nivea Skin Firming Lotion  NutraDerm 30 Skin Lotion  NutraDerm Skin Lotion  NutraDerm Therapeutic Skin Cream  NutraDerm Therapeutic Skin Lotion  ProShield Protective Hand Cream  Provon moisturizing lotion   FAILURE TO FOLLOW THESE INSTRUCTIONS MAY RESULT IN THE CANCELLATION OF YOUR SURGERY  PATIENT SIGNATURE_________________________________  NURSE SIGNATURE__________________________________  ________________________________________________________________________        Nasario Exon    An incentive spirometer is a tool that can help keep your lungs clear and active. This tool measures how well you are filling your lungs with each breath. Taking long deep breaths may help reverse or decrease the chance of developing breathing (pulmonary) problems (especially infection) following: A long period of time when you are unable to move or be active. BEFORE THE PROCEDURE  If the spirometer includes an indicator to show your best effort, your nurse or respiratory therapist will set it to a desired goal. If possible, sit up straight or lean slightly forward. Try not to slouch. Hold the incentive spirometer in an upright position. INSTRUCTIONS FOR USE  Sit on the edge of your bed if possible, or sit up as far as you can in bed or on a chair. Hold the incentive spirometer in an upright position. Breathe out normally. Place the mouthpiece in your mouth and seal your lips tightly around it. Breathe in slowly and as deeply as possible, raising the piston or the ball toward the top of the column. Hold your breath for 3-5 seconds or for as long as possible. Allow the piston or ball to fall to the bottom of the column. Remove the mouthpiece from  your mouth and breathe out normally. Rest for a few seconds and repeat Steps 1 through 7 at least 10 times every 1-2 hours when you are awake. Take your time and take a few normal breaths between deep breaths. The spirometer may include an indicator to show your best  effort. Use the indicator as a goal to work toward during each repetition. After each set of 10 deep breaths, practice coughing to be sure your lungs are clear. If you have an incision (the cut made at the time of surgery), support your incision when coughing by placing a pillow or rolled up towels firmly against it. Once you are able to get out of bed, walk around indoors and cough well. You may stop using the incentive spirometer when instructed by your caregiver.  RISKS AND COMPLICATIONS Take your time so you do not get dizzy or light-headed. If you are in pain, you may need to take or ask for pain medication before doing incentive spirometry. It is harder to take a deep breath if you are having pain. AFTER USE Rest and breathe slowly and easily. It can be helpful to keep track of a log of your progress. Your caregiver can provide you with a simple table to help with this. If you are using the spirometer at home, follow these instructions: SEEK MEDICAL CARE IF:  You are having difficultly using the spirometer. You have trouble using the spirometer as often as instructed. Your pain medication is not giving enough relief while using the spirometer. You develop fever of 100.5 F (38.1 C) or higher.                                                                                                    SEEK IMMEDIATE MEDICAL CARE IF:  You cough up bloody sputum that had not been present before. You develop fever of 102 F (38.9 C) or greater. You develop worsening pain at or near the incision site. MAKE SURE YOU:  Understand these instructions. Will watch your condition. Will get help right away if you are not doing well or get  worse. Document Released: 02/20/2007 Document Revised: 01/02/2012 Document Reviewed: 04/23/2007 [facility] Patient Information 2014 [place], [place].       If you would like to see a video about joint replacement:   [email or web address]

## 2024-04-22 ENCOUNTER — Other Ambulatory Visit: Payer: Self-pay

## 2024-04-22 ENCOUNTER — Encounter (HOSPITAL_COMMUNITY): Payer: Self-pay

## 2024-04-22 ENCOUNTER — Encounter (HOSPITAL_COMMUNITY)
Admission: RE | Admit: 2024-04-22 | Discharge: 2024-04-22 | Disposition: A | Discharge status: Home / Self Care | Source: Ambulatory Visit | Attending: Orthopedic Surgery | Admitting: Orthopedic Surgery

## 2024-04-22 VITALS — BP 154/70 | HR 58 | Temp 98.2°F | Resp 16 | Ht 68.0 in | Wt 205.0 lb

## 2024-04-22 DIAGNOSIS — E119 Type 2 diabetes mellitus without complications: Secondary | ICD-10-CM

## 2024-04-22 DIAGNOSIS — Z8673 Personal history of transient ischemic attack (TIA), and cerebral infarction without residual deficits: Secondary | ICD-10-CM | POA: Diagnosis not present

## 2024-04-22 DIAGNOSIS — M1711 Unilateral primary osteoarthritis, right knee: Secondary | ICD-10-CM | POA: Diagnosis not present

## 2024-04-22 DIAGNOSIS — K219 Gastro-esophageal reflux disease without esophagitis: Secondary | ICD-10-CM | POA: Diagnosis not present

## 2024-04-22 DIAGNOSIS — I129 Hypertensive chronic kidney disease with stage 1 through stage 4 chronic kidney disease, or unspecified chronic kidney disease: Secondary | ICD-10-CM | POA: Insufficient documentation

## 2024-04-22 DIAGNOSIS — N189 Chronic kidney disease, unspecified: Secondary | ICD-10-CM | POA: Insufficient documentation

## 2024-04-22 DIAGNOSIS — E1122 Type 2 diabetes mellitus with diabetic chronic kidney disease: Secondary | ICD-10-CM | POA: Insufficient documentation

## 2024-04-22 DIAGNOSIS — Z794 Long term (current) use of insulin: Secondary | ICD-10-CM | POA: Diagnosis not present

## 2024-04-22 DIAGNOSIS — Z01812 Encounter for preprocedural laboratory examination: Secondary | ICD-10-CM | POA: Diagnosis present

## 2024-04-22 DIAGNOSIS — Z01818 Encounter for other preprocedural examination: Secondary | ICD-10-CM

## 2024-04-22 DIAGNOSIS — I10 Essential (primary) hypertension: Secondary | ICD-10-CM

## 2024-04-22 HISTORY — DX: Gastro-esophageal reflux disease without esophagitis: K21.9

## 2024-04-22 HISTORY — DX: Chronic kidney disease, unspecified: N18.9

## 2024-04-22 LAB — CBC
HCT: 38.7 % — ABNORMAL LOW (ref 39.0–52.0)
Hemoglobin: 12.5 g/dL — ABNORMAL LOW (ref 13.0–17.0)
MCH: 30.9 pg (ref 26.0–34.0)
MCHC: 32.3 g/dL (ref 30.0–36.0)
MCV: 95.6 fL (ref 80.0–100.0)
Platelets: 229 10*3/uL (ref 150–400)
RBC: 4.05 MIL/uL — ABNORMAL LOW (ref 4.22–5.81)
RDW: 13.6 % (ref 11.5–15.5)
WBC: 10.5 10*3/uL (ref 4.0–10.5)
nRBC: 0 % (ref 0.0–0.2)

## 2024-04-22 LAB — SURGICAL PCR SCREEN
MRSA, PCR: NEGATIVE
Staphylococcus aureus: NEGATIVE

## 2024-04-22 LAB — BASIC METABOLIC PANEL WITH GFR
Anion gap: 7 (ref 5–15)
BUN: 60 mg/dL — ABNORMAL HIGH (ref 8–23)
CO2: 23 mmol/L (ref 22–32)
Calcium: 9 mg/dL (ref 8.9–10.3)
Chloride: 107 mmol/L (ref 98–111)
Creatinine, Ser: 2.22 mg/dL — ABNORMAL HIGH (ref 0.61–1.24)
GFR, Estimated: 30 mL/min — ABNORMAL LOW (ref >60)
Glucose, Bld: 94 mg/dL (ref 70–99)
Potassium: 4.6 mmol/L (ref 3.5–5.1)
Sodium: 137 mmol/L (ref 135–145)

## 2024-04-22 LAB — GLUCOSE, CAPILLARY: Glucose-Capillary: 104 mg/dL — ABNORMAL HIGH (ref 70–99)

## 2024-04-23 ENCOUNTER — Encounter (HOSPITAL_COMMUNITY): Payer: Self-pay

## 2024-04-23 NOTE — Progress Notes (Signed)
 Case: [id number] Date/Time: 04/30/24 0945   Procedure: ARTHROPLASTY, KNEE, TOTAL (Right: Knee)   Anesthesia type: Spinal   Diagnosis: Primary osteoarthritis of right knee [M17.11]   Pre-op diagnosis: Right knee osteoarthritis   Location: [facility] ROOM 09 / [facility]   Surgeons: Ernie Cough, MD       DISCUSSION: Alexander Morris is a 78 yo male who presents to PAT prior to surgery above. PMH of HTN, hx of CVA, GERD, IDDM, CKD, arthritis.  Patient follows with Endocrine for IDDM. He takes insulin. A1c on 6/26 was 6.8. Last seen on 04/18/24. All issues stable. Has CKD and Scr stable at 2.2. Advised f/u in 6 months.  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   VS: BP (!) 154/70 Comment: right arm sitting  Pulse (!) 58   Temp 36.8 C (Oral)   Resp 16   Ht 5' 8 (1.727 m)   Wt 93 kg   SpO2 100%   BMI 31.17 kg/m   PROVIDERS: Claudene Elsie JUDITHANN Mickey., MD   LABS: Labs reviewed: Acceptable for surgery. (all labs ordered are listed, but only abnormal results are displayed)  Labs Reviewed  BASIC METABOLIC PANEL WITH GFR - Abnormal; Notable for the following components:      Result Value   BUN 60 (*)    Creatinine, Ser 2.22 (*)    GFR, Estimated 30 (*)    All other components within normal limits  CBC - Abnormal; Notable for the following components:   RBC 4.05 (*)    Hemoglobin 12.5 (*)    HCT 38.7 (*)    All other components within normal limits  GLUCOSE, CAPILLARY - Abnormal; Notable for the following components:   Glucose-Capillary 104 (*)    All other components within normal limits  SURGICAL PCR SCREEN     IMAGES:   EKG 02/14/24:  NSR, rate 66  CV:  Past Medical History:  Diagnosis Date   Arthritis    MINOR - HANDS   Diabetes mellitus    PT ON INSULIN - TYPE II   GERD (gastroesophageal reflux disease)    History of kidney stones    MOST RECENT - 01/06/14 - HAD CYSTO AND STENT PLACEMENT -LEFT   Hypertension    Stroke (HCC) 10/24/2010   MINI-STROKE-EXPERIENCE  SLIGHT NUMBNESS LEFT SIDE AND ELEVATED B/P--STILL HAS SOME NUMBNESS AND WEAKNESS BUT ABLE TO USE ARM AND LEG    Past Surgical History:  Procedure Laterality Date   2010 REMOVAL OF KIDNEY STONE / CYST0     CYSTOSCOPY WITH RETROGRADE PYELOGRAM, URETEROSCOPY AND STENT PLACEMENT Left 01/06/2014   Procedure: CYSTOSCOPY WITH  LEFT RETROGRADE PYELOGRAM, AND LEFT STENT PLACEMENT ;  Surgeon: Toribio Neysa Repine, MD;  Location: [facility];  Service: Urology;  Laterality: Left;   CYSTOSCOPY WITH RETROGRADE PYELOGRAM, URETEROSCOPY AND STENT PLACEMENT Left 01/28/2014   Procedure: CYSTOSCOPY WITH RETROGRADE PYELOGRAM, URETEROSCOPY AND STENT PLACEMENT;  Surgeon: Toribio Neysa Repine, MD;  Location: [facility];  Service: Urology;  Laterality: Left;  LEFT URETER STENT EXCHANGE  LEFT URETER DILATION         EYE SURGERY     LASER BOTH EYES FOR DIABETIC COMPLICATIONS   HOLMIUM LASER APPLICATION Left 01/28/2014   Procedure: HOLMIUM LASER APPLICATION;  Surgeon: Toribio Neysa Repine, MD;  Location[facility];  Service: Urology;  Laterality: Left;   KNEE ARTHROSCOPY W/ ACL RECONSTRUCTION Right 1970    MEDICATIONS:  acetaminophen  (TYLENOL ) 500 MG tablet   aspirin EC 81 MG  tablet   diltiazem (CARDIZEM CD) 300 MG 24 hr capsule   gabapentin (NEURONTIN) 300 MG capsule   insulin degludec (TRESIBA) 200 UNIT/ML FlexTouch Pen   pioglitazone (ACTOS) 45 MG tablet   Polyethyl Glycol-Propyl Glycol (SYSTANE) 0.4-0.3 % SOLN   ramipril (ALTACE) 10 MG capsule   No current facility-administered medications for this encounter.    [name] Short Stay/Anesthesiology [facility] Phone [phone] 04/23/2024 2:43 PM

## 2024-04-23 NOTE — Anesthesia Preprocedure Evaluation (Addendum)
 Anesthesia Evaluation  Patient identified by MRN, date of birth, ID band Patient awake    Reviewed: Allergy & Precautions, H&P , NPO status , Patient's Chart, lab work & pertinent test results  Airway Mallampati: III  TM Distance: >3 FB Neck ROM: Full    Dental no notable dental hx. (+) Teeth Intact, Dental Advisory Given   Pulmonary neg pulmonary ROS   Pulmonary exam normal breath sounds clear to auscultation       Cardiovascular hypertension, Pt. on medications  Rhythm:Regular Rate:Normal     Neuro/Psych negative neurological ROS  negative psych ROS   GI/Hepatic Neg liver ROS,GERD  ,,  Endo/Other  diabetes, Type 2, Insulin  Dependent, Oral Hypoglycemic Agents    Renal/GU Renal InsufficiencyRenal disease  negative genitourinary   Musculoskeletal  (+) Arthritis , Osteoarthritis,    Abdominal   Peds  Hematology negative hematology ROS (+)   Anesthesia Other Findings   Reproductive/Obstetrics negative OB ROS                              Anesthesia Physical Anesthesia Plan  ASA: 3  Anesthesia Plan: Spinal   Post-op Pain Management: Regional block* and Ofirmev  IV (intra-op)*   Induction: Intravenous  PONV Risk Score and Plan: 2 and Ondansetron , Dexamethasone  and Propofol  infusion  Airway Management Planned: Natural Airway and Simple Face Mask  Additional Equipment:   Intra-op Plan:   Post-operative Plan:   Informed Consent: I have reviewed the patients History and Physical, chart, labs and discussed the procedure including the risks, benefits and alternatives for the proposed anesthesia with the patient or authorized representative who has indicated his/her understanding and acceptance.     Dental advisory given  Plan Discussed with: CRNA  Anesthesia Plan Comments: (See PAT note from 6/30)         Anesthesia Quick Evaluation

## 2024-04-30 ENCOUNTER — Observation Stay (HOSPITAL_COMMUNITY)
Admission: RE | Admit: 2024-04-30 | Discharge: 2024-05-01 | Disposition: A | Discharge status: Home / Self Care | Source: Ambulatory Visit | Attending: Orthopedic Surgery | Admitting: Orthopedic Surgery

## 2024-04-30 ENCOUNTER — Ambulatory Visit (HOSPITAL_COMMUNITY): Payer: Self-pay | Admitting: Physician Assistant

## 2024-04-30 ENCOUNTER — Other Ambulatory Visit: Payer: Self-pay

## 2024-04-30 ENCOUNTER — Ambulatory Visit (HOSPITAL_COMMUNITY): Admitting: Anesthesiology

## 2024-04-30 ENCOUNTER — Encounter (HOSPITAL_COMMUNITY): Payer: Self-pay | Admitting: Orthopedic Surgery

## 2024-04-30 ENCOUNTER — Encounter (HOSPITAL_COMMUNITY): Admission: RE | Payer: Self-pay | Source: Ambulatory Visit

## 2024-04-30 DIAGNOSIS — N189 Chronic kidney disease, unspecified: Secondary | ICD-10-CM

## 2024-04-30 DIAGNOSIS — I129 Hypertensive chronic kidney disease with stage 1 through stage 4 chronic kidney disease, or unspecified chronic kidney disease: Secondary | ICD-10-CM | POA: Insufficient documentation

## 2024-04-30 DIAGNOSIS — E119 Type 2 diabetes mellitus without complications: Secondary | ICD-10-CM

## 2024-04-30 DIAGNOSIS — I679 Cerebrovascular disease, unspecified: Secondary | ICD-10-CM

## 2024-04-30 DIAGNOSIS — M1711 Unilateral primary osteoarthritis, right knee: Secondary | ICD-10-CM

## 2024-04-30 DIAGNOSIS — Z96651 Presence of right artificial knee joint: Principal | ICD-10-CM

## 2024-04-30 DIAGNOSIS — E1122 Type 2 diabetes mellitus with diabetic chronic kidney disease: Secondary | ICD-10-CM | POA: Diagnosis not present

## 2024-04-30 DIAGNOSIS — Z794 Long term (current) use of insulin: Secondary | ICD-10-CM | POA: Diagnosis not present

## 2024-04-30 DIAGNOSIS — I10 Essential (primary) hypertension: Secondary | ICD-10-CM

## 2024-04-30 DIAGNOSIS — Z01818 Encounter for other preprocedural examination: Secondary | ICD-10-CM

## 2024-04-30 DIAGNOSIS — Z8673 Personal history of transient ischemic attack (TIA), and cerebral infarction without residual deficits: Secondary | ICD-10-CM | POA: Diagnosis not present

## 2024-04-30 DIAGNOSIS — Z7982 Long term (current) use of aspirin: Secondary | ICD-10-CM | POA: Diagnosis not present

## 2024-04-30 HISTORY — PX: TOTAL KNEE ARTHROPLASTY: SHX125

## 2024-04-30 LAB — GLUCOSE, CAPILLARY
Glucose-Capillary: 110 mg/dL — ABNORMAL HIGH (ref 70–99)
Glucose-Capillary: 63 mg/dL — ABNORMAL LOW (ref 70–99)
Glucose-Capillary: 86 mg/dL (ref 70–99)
Glucose-Capillary: 101 mg/dL — ABNORMAL HIGH (ref 70–99)
Glucose-Capillary: 82 mg/dL (ref 70–99)
Glucose-Capillary: 173 mg/dL — ABNORMAL HIGH (ref 70–99)

## 2024-04-30 SURGERY — ARTHROPLASTY, KNEE, TOTAL
Anesthesia: Spinal | Site: Knee | Laterality: Right

## 2024-04-30 MED ORDER — CEFAZOLIN SODIUM-DEXTROSE 2-4 GM/100ML-% IV SOLN
2.0000 g | Freq: Four times a day (QID) | INTRAVENOUS | Status: AC
  Administered 2024-04-30 (×2): 2 g via INTRAVENOUS
  Filled 2024-04-30 (×2): qty 100

## 2024-04-30 MED ORDER — ONDANSETRON HCL 4 MG/2ML IJ SOLN: 4.0000 mg | Freq: Four times a day (QID) | INTRAMUSCULAR | Status: DC | PRN

## 2024-04-30 MED ORDER — POLYETHYLENE GLYCOL 3350 17 G PO PACK
17.0000 g | PACK | Freq: Two times a day (BID) | ORAL | Status: DC
  Administered 2024-04-30 – 2024-05-01 (×2): 17 g via ORAL
  Filled 2024-04-30: qty 1

## 2024-04-30 MED ORDER — MENTHOL 3 MG MT LOZG: 1.0000 | LOZENGE | OROMUCOSAL | Status: DC | PRN

## 2024-04-30 MED ORDER — DEXTROSE 50 % IV SOLN
INTRAVENOUS | Status: AC
  Administered 2024-04-30: 25 g via INTRAVENOUS
  Filled 2024-04-30: qty 50

## 2024-04-30 MED ORDER — GABAPENTIN 300 MG PO CAPS
300.0000 mg | ORAL_CAPSULE | Freq: Every day | ORAL | Status: DC
  Administered 2024-04-30 – 2024-05-01 (×2): 300 mg via ORAL
  Filled 2024-04-30 (×2): qty 1

## 2024-04-30 MED ORDER — ORAL CARE MOUTH RINSE: 15.0000 mL | Freq: Once | OROMUCOSAL | Status: AC

## 2024-04-30 MED ORDER — SODIUM CHLORIDE 0.9 % IR SOLN
Status: DC | PRN
  Administered 2024-04-30: 1000 mL

## 2024-04-30 MED ORDER — PIOGLITAZONE HCL 15 MG PO TABS
45.0000 mg | ORAL_TABLET | Freq: Every morning | ORAL | Status: DC
  Administered 2024-05-01: 45 mg via ORAL
  Filled 2024-04-30: qty 3

## 2024-04-30 MED ORDER — ONDANSETRON HCL 4 MG/2ML IJ SOLN
INTRAMUSCULAR | Status: AC
  Filled 2024-04-30: qty 2

## 2024-04-30 MED ORDER — BISACODYL 10 MG RE SUPP
10.0000 mg | Freq: Every day | RECTAL | Status: DC | PRN
Start: 2024-04-30 — End: 2024-05-01

## 2024-04-30 MED ORDER — PROPOFOL 1000 MG/100ML IV EMUL
INTRAVENOUS | Status: AC
  Filled 2024-04-30: qty 100

## 2024-04-30 MED ORDER — INSULIN DEGLUDEC 200 UNIT/ML ~~LOC~~ SOPN: 100.0000 [IU] | PEN_INJECTOR | Freq: Every day | SUBCUTANEOUS | Status: DC

## 2024-04-30 MED ORDER — MIDAZOLAM HCL 2 MG/2ML IJ SOLN: 1.0000 mg | Freq: Once | INTRAMUSCULAR | Status: DC

## 2024-04-30 MED ORDER — OXYCODONE HCL 5 MG PO TABS: 10.0000 mg | ORAL_TABLET | ORAL | Status: DC | PRN

## 2024-04-30 MED ORDER — METHOCARBAMOL 1000 MG/10ML IJ SOLN: 500.0000 mg | Freq: Four times a day (QID) | INTRAMUSCULAR | Status: DC | PRN

## 2024-04-30 MED ORDER — CEFAZOLIN SODIUM-DEXTROSE 2-4 GM/100ML-% IV SOLN
2.0000 g | INTRAVENOUS | Status: AC
  Administered 2024-04-30: 2 g via INTRAVENOUS
  Filled 2024-04-30: qty 100

## 2024-04-30 MED ORDER — ACETAMINOPHEN 500 MG PO TABS
1000.0000 mg | ORAL_TABLET | Freq: Four times a day (QID) | ORAL | Status: DC
Start: 2024-04-30 — End: 2024-05-04
  Administered 2024-04-30 – 2024-05-01 (×3): 1000 mg via ORAL
  Filled 2024-04-30 (×4): qty 2

## 2024-04-30 MED ORDER — BUPIVACAINE-EPINEPHRINE (PF) 0.5% -1:200000 IJ SOLN
INTRAMUSCULAR | Status: DC | PRN
  Administered 2024-04-30: 20 mL via PERINEURAL

## 2024-04-30 MED ORDER — PHENYLEPHRINE 80 MCG/ML (10ML) SYRINGE FOR IV PUSH (FOR BLOOD PRESSURE SUPPORT)
PREFILLED_SYRINGE | INTRAVENOUS | Status: DC | PRN
  Administered 2024-04-30 (×5): 80 ug via INTRAVENOUS

## 2024-04-30 MED ORDER — POVIDONE-IODINE 10 % EX SWAB: 2.0000 | Freq: Once | CUTANEOUS | Status: DC

## 2024-04-30 MED ORDER — PHENOL 1.4 % MT LIQD: 1.0000 | OROMUCOSAL | Status: DC | PRN

## 2024-04-30 MED ORDER — DEXAMETHASONE SODIUM PHOSPHATE 10 MG/ML IJ SOLN: 8.0000 mg | Freq: Once | INTRAMUSCULAR | Status: DC

## 2024-04-30 MED ORDER — SODIUM CHLORIDE (PF) 0.9 % IJ SOLN
INTRAMUSCULAR | Status: AC
Start: 2024-04-30 — End: 2024-04-30
  Filled 2024-04-30: qty 30

## 2024-04-30 MED ORDER — RAMIPRIL 5 MG PO CAPS
10.0000 mg | ORAL_CAPSULE | Freq: Every morning | ORAL | Status: DC
  Administered 2024-05-01: 10 mg via ORAL
  Filled 2024-04-30: qty 2

## 2024-04-30 MED ORDER — ONDANSETRON HCL 4 MG PO TABS: 4.0000 mg | ORAL_TABLET | Freq: Four times a day (QID) | ORAL | Status: DC | PRN

## 2024-04-30 MED ORDER — METOCLOPRAMIDE HCL 5 MG/ML IJ SOLN: 5.0000 mg | Freq: Three times a day (TID) | INTRAMUSCULAR | Status: DC | PRN

## 2024-04-30 MED ORDER — TRANEXAMIC ACID-NACL 1000-0.7 MG/100ML-% IV SOLN
1000.0000 mg | Freq: Once | INTRAVENOUS | Status: AC
  Administered 2024-04-30: 1000 mg via INTRAVENOUS
  Filled 2024-04-30: qty 100

## 2024-04-30 MED ORDER — DEXAMETHASONE SODIUM PHOSPHATE 10 MG/ML IJ SOLN
10.0000 mg | Freq: Once | INTRAMUSCULAR | Status: AC
  Administered 2024-05-01: 10 mg via INTRAVENOUS
  Filled 2024-04-30: qty 1

## 2024-04-30 MED ORDER — LACTATED RINGERS IV SOLN: INTRAVENOUS | Status: DC

## 2024-04-30 MED ORDER — SENNA 8.6 MG PO TABS
2.0000 | ORAL_TABLET | Freq: Every day | ORAL | Status: DC
  Administered 2024-04-30: 17.2 mg via ORAL
  Filled 2024-04-30: qty 2

## 2024-04-30 MED ORDER — ASPIRIN 81 MG PO CHEW
81.0000 mg | CHEWABLE_TABLET | Freq: Two times a day (BID) | ORAL | Status: DC
  Administered 2024-04-30 – 2024-05-01 (×2): 81 mg via ORAL
  Filled 2024-04-30 (×2): qty 1

## 2024-04-30 MED ORDER — DILTIAZEM HCL ER COATED BEADS 300 MG PO CP24
300.0000 mg | ORAL_CAPSULE | Freq: Every day | ORAL | Status: DC
Start: 2024-04-30 — End: 2024-05-01
  Administered 2024-04-30 – 2024-05-01 (×2): 300 mg via ORAL
  Filled 2024-04-30 (×2): qty 1

## 2024-04-30 MED ORDER — BUPIVACAINE-EPINEPHRINE (PF) 0.25% -1:200000 IJ SOLN
INTRAMUSCULAR | Status: AC
  Filled 2024-04-30: qty 30

## 2024-04-30 MED ORDER — HYDROMORPHONE HCL 1 MG/ML IJ SOLN: 0.5000 mg | INTRAMUSCULAR | Status: DC | PRN

## 2024-04-30 MED ORDER — KETOROLAC TROMETHAMINE 30 MG/ML IJ SOLN
INTRAMUSCULAR | Status: AC
  Filled 2024-04-30: qty 1

## 2024-04-30 MED ORDER — 0.9 % SODIUM CHLORIDE (POUR BTL) OPTIME
TOPICAL | Status: DC | PRN
  Administered 2024-04-30: 1000 mL

## 2024-04-30 MED ORDER — ALUM & MAG HYDROXIDE-SIMETH 200-200-20 MG/5ML PO SUSP: 30.0000 mL | ORAL | Status: DC | PRN

## 2024-04-30 MED ORDER — INSULIN GLARGINE-YFGN 100 UNIT/ML ~~LOC~~ SOLN
100.0000 [IU] | Freq: Every day | SUBCUTANEOUS | Status: DC
  Administered 2024-04-30: 100 [IU] via SUBCUTANEOUS
  Filled 2024-04-30 (×2): qty 1

## 2024-04-30 MED ORDER — OXYCODONE HCL 5 MG PO TABS
5.0000 mg | ORAL_TABLET | ORAL | Status: DC | PRN
  Administered 2024-05-01: 5 mg via ORAL
  Filled 2024-04-30: qty 1

## 2024-04-30 MED ORDER — STERILE WATER FOR IRRIGATION IR SOLN
Status: DC | PRN
Start: 2024-04-30 — End: 2024-04-30
  Administered 2024-04-30: 2000 mL

## 2024-04-30 MED ORDER — CELECOXIB 200 MG PO CAPS
200.0000 mg | ORAL_CAPSULE | Freq: Two times a day (BID) | ORAL | Status: DC
  Administered 2024-04-30 (×2): 200 mg via ORAL
  Filled 2024-04-30 (×2): qty 1

## 2024-04-30 MED ORDER — SODIUM CHLORIDE (PF) 0.9 % IJ SOLN
INTRAMUSCULAR | Status: DC | PRN
  Administered 2024-04-30: 61 mL

## 2024-04-30 MED ORDER — METOCLOPRAMIDE HCL 5 MG PO TABS: 5.0000 mg | ORAL_TABLET | Freq: Three times a day (TID) | ORAL | Status: DC | PRN

## 2024-04-30 MED ORDER — BUPIVACAINE IN DEXTROSE 0.75-8.25 % IT SOLN
INTRATHECAL | Status: DC | PRN
Start: 2024-04-30 — End: 2024-04-30
  Administered 2024-04-30: 1.6 mL via INTRATHECAL

## 2024-04-30 MED ORDER — DEXTROSE 50 % IV SOLN: 25.0000 g | INTRAVENOUS | Status: AC

## 2024-04-30 MED ORDER — PROPOFOL 500 MG/50ML IV EMUL
INTRAVENOUS | Status: DC | PRN
Start: 2024-04-30 — End: 2024-04-30
  Administered 2024-04-30: 80 ug/kg/min via INTRAVENOUS

## 2024-04-30 MED ORDER — FENTANYL CITRATE PF 50 MCG/ML IJ SOSY: 25.0000 ug | PREFILLED_SYRINGE | INTRAMUSCULAR | Status: DC | PRN

## 2024-04-30 MED ORDER — FENTANYL CITRATE PF 50 MCG/ML IJ SOSY
50.0000 ug | PREFILLED_SYRINGE | Freq: Once | INTRAMUSCULAR | Status: AC
  Administered 2024-04-30: 50 ug via INTRAVENOUS
  Filled 2024-04-30: qty 2

## 2024-04-30 MED ORDER — TRANEXAMIC ACID-NACL 1000-0.7 MG/100ML-% IV SOLN
INTRAVENOUS | Status: DC | PRN
  Administered 2024-04-30: 1000 mg via INTRAVENOUS

## 2024-04-30 MED ORDER — TRANEXAMIC ACID-NACL 1000-0.7 MG/100ML-% IV SOLN
1000.0000 mg | INTRAVENOUS | Status: DC
Start: 2024-04-30 — End: 2024-04-30
  Filled 2024-04-30: qty 100

## 2024-04-30 MED ORDER — DIPHENHYDRAMINE HCL 12.5 MG/5ML PO ELIX: 12.5000 mg | ORAL_SOLUTION | ORAL | Status: DC | PRN

## 2024-04-30 MED ORDER — METHOCARBAMOL 500 MG PO TABS
500.0000 mg | ORAL_TABLET | Freq: Four times a day (QID) | ORAL | Status: DC | PRN
  Filled 2024-04-30: qty 1

## 2024-04-30 MED ORDER — CHLORHEXIDINE GLUCONATE 0.12 % MT SOLN
15.0000 mL | Freq: Once | OROMUCOSAL | Status: AC
  Administered 2024-04-30: 15 mL via OROMUCOSAL

## 2024-04-30 MED ORDER — PHENYLEPHRINE 80 MCG/ML (10ML) SYRINGE FOR IV PUSH (FOR BLOOD PRESSURE SUPPORT)
PREFILLED_SYRINGE | INTRAVENOUS | Status: AC
  Filled 2024-04-30: qty 10

## 2024-04-30 SURGICAL SUPPLY — 46 items
ATTUNE MED ANAT PAT 35 KNEE (Knees) IMPLANT
ATTUNE PS FEM RT SZ 6 CEM KNEE (Femur) IMPLANT
ATTUNE PSRP INSR SZ6 5 KNEE (Insert) IMPLANT
BAG COUNTER SPONGE SURGICOUNT (BAG) IMPLANT
BAG ZIPLOCK 12X15 (MISCELLANEOUS) ×1 IMPLANT
BASE TIBIAL ROT PLAT SZ 5 KNEE (Knees) IMPLANT
BLADE SAW SGTL 13.0X1.19X90.0M (BLADE) ×1 IMPLANT
BNDG ELASTIC 6INX 5YD STR LF (GAUZE/BANDAGES/DRESSINGS) ×1 IMPLANT
BOWL SMART MIX CTS (DISPOSABLE) ×1 IMPLANT
CEMENT HV SMART SET (Cement) ×2 IMPLANT
COOLER ICEMAN CLASSIC (MISCELLANEOUS) IMPLANT
COVER SURGICAL LIGHT HANDLE (MISCELLANEOUS) ×1 IMPLANT
CUFF TRNQT CYL 34X4.125X (TOURNIQUET CUFF) ×1 IMPLANT
DERMABOND ADVANCED .7 DNX12 (GAUZE/BANDAGES/DRESSINGS) ×1 IMPLANT
DRAPE U-SHAPE 47X51 STRL (DRAPES) ×1 IMPLANT
DRESSING AQUACEL AG SP 3.5X10 (GAUZE/BANDAGES/DRESSINGS) ×1 IMPLANT
DURAPREP 26ML APPLICATOR (WOUND CARE) ×2 IMPLANT
ELECT REM PT RETURN 15FT ADLT (MISCELLANEOUS) ×1 IMPLANT
GLOVE BIO SURGEON STRL SZ 6 (GLOVE) ×1 IMPLANT
GLOVE BIOGEL PI IND STRL 6.5 (GLOVE) ×1 IMPLANT
GLOVE BIOGEL PI IND STRL 7.5 (GLOVE) ×1 IMPLANT
GLOVE ORTHO TXT STRL SZ7.5 (GLOVE) ×2 IMPLANT
GOWN STRL REUS W/ TWL LRG LVL3 (GOWN DISPOSABLE) ×2 IMPLANT
HOLDER FOLEY CATH W/STRAP (MISCELLANEOUS) IMPLANT
KIT TURNOVER KIT A (KITS) ×1 IMPLANT
MANIFOLD NEPTUNE II (INSTRUMENTS) ×1 IMPLANT
NDL SAFETY ECLIPSE 18X1.5 (NEEDLE) IMPLANT
NS IRRIG 1000ML POUR BTL (IV SOLUTION) ×1 IMPLANT
PACK TOTAL KNEE CUSTOM (KITS) ×1 IMPLANT
PAD COLD SHLDR WRAP-ON (PAD) IMPLANT
PENCIL SMOKE EVACUATOR (MISCELLANEOUS) ×1 IMPLANT
PIN FIX SIGMA LCS THRD HI (PIN) IMPLANT
PROTECTOR NERVE ULNAR (MISCELLANEOUS) ×1 IMPLANT
SET HNDPC FAN SPRY TIP SCT (DISPOSABLE) ×1 IMPLANT
SET PAD KNEE POSITIONER (MISCELLANEOUS) ×1 IMPLANT
SPIKE FLUID TRANSFER (MISCELLANEOUS) ×2 IMPLANT
SUT MNCRL AB 4-0 PS2 18 (SUTURE) ×1 IMPLANT
SUT STRATAFIX PDS+ 0 24IN (SUTURE) ×1 IMPLANT
SUT VIC AB 1 CT1 36 (SUTURE) ×1 IMPLANT
SUT VIC AB 2-0 CT1 TAPERPNT 27 (SUTURE) ×2 IMPLANT
SYR 3ML LL SCALE MARK (SYRINGE) ×1 IMPLANT
TOWEL GREEN STERILE FF (TOWEL DISPOSABLE) ×1 IMPLANT
TRAY FOLEY MTR SLVR 16FR STAT (SET/KITS/TRAYS/PACK) ×1 IMPLANT
TUBE SUCTION HIGH CAP CLEAR NV (SUCTIONS) ×1 IMPLANT
WATER STERILE IRR 1000ML POUR (IV SOLUTION) ×2 IMPLANT
WRAP KNEE MAXI GEL POST OP (GAUZE/BANDAGES/DRESSINGS) ×1 IMPLANT

## 2024-04-30 NOTE — OR Nursing (Signed)
 Patient arrived to short stay for surgery- states his CBG was 60 so he took a shot glass of orange juice this am at 0540. When first questioned if it had pulp he said yes, but the he said he didn't know. Dr. Epifanio aware and states by the time pt goes back for surgery it will have been 4 hours.

## 2024-04-30 NOTE — Transfer of Care (Signed)
 Immediate Anesthesia Transfer of Care Note  Patient: Alexander Morris  Procedure(s) Performed: ARTHROPLASTY, KNEE, TOTAL (Right: Knee)  Patient Location: PACU  Anesthesia Type:Spinal  Level of Consciousness: drowsy and patient cooperative  Airway & Oxygen Therapy: Patient Spontanous Breathing and Patient connected to face mask oxygen  Post-op Assessment: Report given to RN and Post -op Vital signs reviewed and stable  Post vital signs: Reviewed and stable  Last Vitals:  Vitals Value Taken Time  BP 113/51 04/30/24 11:16  Temp    Pulse 55 04/30/24 11:20  Resp 13 04/30/24 11:20  SpO2 93 % 04/30/24 11:20  Vitals shown include unfiled device data.  Last Pain:  Vitals:   04/30/24 0905  TempSrc:   PainSc: 0-No pain         Complications: No notable events documented.

## 2024-04-30 NOTE — Op Note (Addendum)
 NAME:  Alexander Morris                      MEDICAL RECORD NO.:  [id number]                             FACILITY:  [facility] [place]      PHYSICIAN:  Donnice BIRCH. Ernie, M.D.  DATE OF BIRTH:  07-Feb-1946      DATE OF PROCEDURE:  04/30/2024                                     OPERATIVE REPORT         PREOPERATIVE DIAGNOSIS:  Right knee osteoarthritis.      POSTOPERATIVE DIAGNOSIS:  Right knee osteoarthritis.      FINDINGS:  The patient was noted to have complete loss of cartilage and   bone-on-bone arthritis with associated osteophytes in the medial and patellofemoral compartments of   the knee with a significant synovitis and associated effusion.  The patient had failed months of conservative treatment including medications, injection therapy, activity modification.     PROCEDURE:  Right total knee replacement.      COMPONENTS USED:  DePuy Attune rotating platform posterior stabilized knee   system, a size 6 femur, 5 tibia, size 5 mm PS AOX insert, and 35 anatomic patellar   button.      SURGEON:  Donnice BIRCH. Ernie, M.D.      ASSISTANT:  Rosina Calin, PA-C.      ANESTHESIA:  Regional and Spinal.      SPECIMENS:  None.      COMPLICATION:  None.      DRAINS:  None.  EBL: <250 cc      TOURNIQUET TIME:  30 min at 225 mmHg     The patient was stable to the recovery room.      INDICATION FOR PROCEDURE:  Alexander Morris is a 78 y.o. male patient of   mine.  The patient had been seen, evaluated, and treated for months conservatively in the   office with medication, activity modification, and injections.  The patient had   radiographic changes of bone-on-bone arthritis with endplate sclerosis and osteophytes noted.  Based on the radiographic changes and failed conservative measures, the patient   decided to proceed with definitive treatment, total knee replacement.  Risks of infection, DVT, component failure, need for revision surgery, neurovascular injury were reviewed in the office setting.  The  postop course was reviewed stressing the efforts to maximize post-operative satisfaction and function.  Consent was obtained for benefit of pain   relief.      PROCEDURE IN DETAIL:  The patient was brought to the operative theater.   Once adequate anesthesia, preoperative antibiotics, 2 gm of Ancef ,1 gm of Tranexamic Acid , and 10 mg of Decadron  administered, the patient was positioned supine with a right thigh tourniquet placed.  The  right lower extremity was prepped and draped in sterile fashion.  A time-   out was performed identifying the patient, planned procedure, and the appropriate extremity.      The right lower extremity was placed in the [facility] leg holder.  The leg was   exsanguinated, tourniquet elevated to 225 mmHg.  A midline incision was   made followed by median parapatellar arthrotomy.  Following initial   exposure, attention was  first directed to the patella.  Precut   measurement was noted to be 26 mm.  I resected down to 14 mm and used a   35 anatomic patellar button to restore patellar height as well as cover the cut surface.      The lug holes were drilled and a metal shim was placed to protect the   patella from retractors and saw blade during the procedure.      At this point, attention was now directed to the femur.  The femoral   canal was opened with a drill, irrigated to try to prevent fat emboli.  An   intramedullary rod was passed at 5 degrees valgus, 10 mm of bone was   resected off the distal femur.  Following this resection, the tibia was   subluxated anteriorly.  Using the extramedullary guide, 2 mm of bone was resected off   the proximal medial tibia.  We confirmed the gap would be   stable medially and laterally with a size 5 spacer block as well as confirmed that the tibial cut was perpendicular in the coronal plane, checking with an alignment rod.      Once this was done, I sized the femur to be a size 6 in the anterior-   posterior dimension, chose a  standard component based on medial and   lateral dimension.  The size 6 rotation block was then pinned in   position anterior referenced using the C-clamp to set rotation.  The   anterior, posterior, and  chamfer cuts were made without difficulty nor   notching making certain that I was along the anterior cortex to help   with flexion gap stability.      The final box cut was made off the lateral aspect of distal femur.      At this point, the tibia was sized to be a size 5.  The size 5 tray was   then pinned in position through the medial third of the tubercle,   drilled, and keel punched.  Trial reduction was now carried with a 6 femur,  5 tibia, a size 5 mm PS insert, and the 35 anatomic patella botton.  The knee was brought to full extension with good flexion stability with the patella   tracking through the trochlea without application of pressure.  Given   all these findings the trial components removed.  Final components were   opened and cement was mixed.  The knee was irrigated with normal saline solution and pulse lavage.  The synovial lining was   then injected with 30 cc of 0.25% Marcaine  with epinephrine , 1 cc of Toradol  and 30 cc of NS for a total of 61 cc.     Final implants were then cemented onto cleaned and dried cut surfaces of bone with the knee brought to extension with a size 5 mm PS trial insert.      Once the cement had fully cured, excess cement was removed   throughout the knee.  I confirmed that I was satisfied with the range of   motion and stability, and the final size 5 mm PS AOX insert was chosen.  It was   placed into the knee.      The tourniquet had been let down at 30 minutes.  No significant   hemostasis was required.  The extensor mechanism was then reapproximated using #1 Vicryl and #1 Stratafix sutures with the knee   in flexion.  The  remaining wound was closed with 2-0 Vicryl and running 4-0 Monocryl.   The knee was cleaned, dried, dressed  sterilely using Dermabond and   Aquacel dressing.  The patient was then   brought to recovery room in stable condition, tolerating the procedure   well.   Please note that Physician Assistant, Rosina Calin, PA-C was present for the entirety of the case, and was utilized for pre-operative positioning, peri-operative retractor management, general facilitation of the procedure and for primary wound closure at the end of the case.              Donnice CORDOBA Ernie, M.D.    04/30/2024 8:33 AM

## 2024-04-30 NOTE — Interval H&P Note (Signed)
 History and Physical Interval Note:  04/30/2024 8:33 AM  Alexander Morris  has presented today for surgery, with the diagnosis of Right knee osteoarthritis.  The various methods of treatment have been discussed with the patient and family. After consideration of risks, benefits and other options for treatment, the patient has consented to  Procedure(s): ARTHROPLASTY, KNEE, TOTAL (Right) as a surgical intervention.  The patient's history has been reviewed, patient examined, no change in status, stable for surgery.  I have reviewed the patient's chart and labs.  Questions were answered to the patient's satisfaction.     Alexander Morris

## 2024-04-30 NOTE — Anesthesia Postprocedure Evaluation (Signed)
 Anesthesia Post Note  Patient: Marinda JAYSON Mania  Procedure(s) Performed: ARTHROPLASTY, KNEE, TOTAL (Right: Knee)     Patient location during evaluation: PACU Anesthesia Type: Spinal and Regional Level of consciousness: oriented and awake and alert Pain management: pain level controlled Vital Signs Assessment: post-procedure vital signs reviewed and stable Respiratory status: spontaneous breathing and respiratory function stable Cardiovascular status: blood pressure returned to baseline and stable Postop Assessment: no headache, no backache, no apparent nausea or vomiting, spinal receding and patient able to bend at knees Anesthetic complications: no   No notable events documented.  Last Vitals:  Vitals:   04/30/24 1245 04/30/24 1332  BP: (!) 153/72 (!) 164/59  Pulse: (!) 57 (!) 53  Resp: 20 18  Temp:  36.4 C  SpO2: 99% 99%    Last Pain:  Vitals:   04/30/24 1332  TempSrc: Oral  PainSc: 0-No pain                 Jaylyne Breese,W. EDMOND

## 2024-04-30 NOTE — Evaluation (Signed)
 Physical Therapy Evaluation Patient Details Name: Alexander Morris MRN: [id number] DOB: 1946-09-20 Today's Date: 04/30/2024  History of Present Illness  78 yo male presents to therapy s/p R TKA on 04/30/2024 due to failure of conservative measures. Pt PMH includes but is not limited to: arthritis, CKD, DM II, GERD, kidney stones, HTN, and CVA.  Clinical Impression    Alexander Morris is a 78 y.o. male POD 0 s/p R TKA. Patient reports IND with mobility at baseline. Patient is now limited by functional impairments (see PT problem list below) and requires S for bed mobility and CGA and cues for transfers. Patient was able to ambulate 65 feet with RW and CGA level of assist. Patient instructed in exercise to facilitate ROM and circulation to manage edema. Patient will benefit from continued skilled PT interventions to address impairments and progress towards PLOF. Acute PT will follow to progress mobility and stair training in preparation for safe discharge home with family support and OPPT services scheduled for 7/10.       If plan is discharge home, recommend the following: A little help with walking and/or transfers;A little help with bathing/dressing/bathroom;Assistance with cooking/housework;Assist for transportation;Help with stairs or ramp for entrance   Can travel by private vehicle        Equipment Recommendations None recommended by PT  Recommendations for Other Services       Functional Status Assessment Patient has had a recent decline in their functional status and demonstrates the ability to make significant improvements in function in a reasonable and predictable amount of time.     Precautions / Restrictions Precautions Precautions: Knee;Fall Restrictions Weight Bearing Restrictions Per Provider Order: No      Mobility  Bed Mobility Overal bed mobility: Needs Assistance Bed Mobility: Supine to Sit     Supine to sit: Supervision, HOB elevated, Used rails     General bed  mobility comments: min cues    Transfers Overall transfer level: Needs assistance Equipment used: Rolling walker (2 wheels) Transfers: Sit to/from Stand Sit to Stand: Contact guard assist           General transfer comment: min cues    Ambulation/Gait Ambulation/Gait assistance: Contact guard assist Gait Distance (Feet): 65 Feet Assistive device: Rolling walker (2 wheels) Gait Pattern/deviations: Step-to pattern, Decreased stance time - right, Antalgic, Trunk flexed Gait velocity: decreased     General Gait Details: slightly antalgic pattern with trunk flexion and B UE support at RW, pt reports no pain even with gait tasks at eval, min cues for posture, proper distance from RW and RW management  Stairs            Wheelchair Mobility     Tilt Bed    Modified Rankin (Stroke Patients Only)       Balance Overall balance assessment: Needs assistance Sitting-balance support: Feet supported Sitting balance-Leahy Scale: Good     Standing balance support: Bilateral upper extremity supported, During functional activity, Reliant on assistive device for balance Standing balance-Leahy Scale: Poor                               Pertinent Vitals/Pain Pain Assessment Pain Assessment: No/denies pain    Home Living Family/patient expects to be discharged to:: Private residence Living Arrangements: Alone Available Help at Discharge: Family Type of Home: House (condo on second floor) Home Access: Stairs to enter Entrance Stairs-Rails: Doctor, general practice of Steps: 2 flights, Engineer, structural  is broken   Home Layout: One level Home Equipment: Agricultural consultant (2 wheels) Additional Comments: pt reports if the elevator is still broken will d/c to daughters home with 3 steps to enter and no handrail (iceman machine)    Prior Function Prior Level of Function : Independent/Modified Independent;Driving             Mobility Comments: IND no AD for all  ADLs, self care tasks and IADLs       Extremity/Trunk Assessment        Lower Extremity Assessment Lower Extremity Assessment: RLE deficits/detail RLE Deficits / Details: ankle DF/PF 5/5; SLR , 10 degree lag RLE Sensation: WNL    Cervical / Trunk Assessment Cervical / Trunk Assessment: Normal  Communication   Communication Communication: No apparent difficulties    Cognition Arousal: Alert Behavior During Therapy: WFL for tasks assessed/performed   PT - Cognitive impairments: No apparent impairments                         Following commands: Intact       Cueing       General Comments General comments (skin integrity, edema, etc.): pt reports feeling pressure in bladder and need to void with catheter in place.    Exercises Total Joint Exercises Ankle Circles/Pumps: AROM, Both, 10 reps   Assessment/Plan    PT Assessment Patient needs continued PT services  PT Problem List Decreased strength;Decreased range of motion;Decreased balance;Decreased activity tolerance;Decreased mobility;Decreased coordination       PT Treatment Interventions DME instruction;Gait training;Stair training;Functional mobility training;Therapeutic activities;Therapeutic exercise;Balance training;Neuromuscular re-education;Patient/family education;Modalities    PT Goals (Current goals can be found in the Care Plan section)  Acute Rehab PT Goals Patient Stated Goal: to return to the golf course PT Goal Formulation: With patient Time For Goal Achievement: 05/14/24 Potential to Achieve Goals: Good    Frequency 7X/week     Co-evaluation               AM-PAC PT 6 Clicks Mobility  Outcome Measure Help needed turning from your back to your side while in a flat bed without using bedrails?: A Little Help needed moving from lying on your back to sitting on the side of a flat bed without using bedrails?: A Little Help needed moving to and from a bed to a chair (including a  wheelchair)?: A Little Help needed standing up from a chair using your arms (e.g., wheelchair or bedside chair)?: A Little Help needed to walk in hospital room?: A Little Help needed climbing 3-5 steps with a railing? : A Lot 6 Click Score: 17    End of Session Equipment Utilized During Treatment: Gait belt Activity Tolerance: Patient tolerated treatment well;No increased pain Patient left: in chair;with call bell/phone within reach;with chair alarm set Nurse Communication: Mobility status PT Visit Diagnosis: Unsteadiness on feet (R26.81);Other abnormalities of gait and mobility (R26.89);Muscle weakness (generalized) (M62.81);Difficulty in walking, not elsewhere classified (R26.2)    Time: 8387-8370 PT Time Calculation (min) (ACUTE ONLY): 17 min   Charges:   PT Evaluation $PT Eval Low Complexity: 1 Low   PT General Charges $$ ACUTE PT VISIT: 1 Visit         [place], PT Acute Rehab   [place] VEAR Drone 04/30/2024, 4:41 PM

## 2024-04-30 NOTE — Discharge Instructions (Addendum)
 INSTRUCTIONS AFTER JOINT REPLACEMENT   Remove items at home which could result in a fall. This includes throw rugs or furniture in walking pathways ICE to the affected joint every three hours while awake for 30 minutes at a time, for at least the first 3-5 days, and then as needed for pain and swelling.  Continue to use ice for pain and swelling. You may notice swelling that will progress down to the foot and ankle.  This is normal after surgery.  Elevate your leg when you are not up walking on it.   Continue to use the breathing machine you got in the hospital (incentive spirometer) which will help keep your temperature down.  It is common for your temperature to cycle up and down following surgery, especially at night when you are not up moving around and exerting yourself.  The breathing machine keeps your lungs expanded and your temperature down.   DIET:  As you were doing prior to hospitalization, we recommend a well-balanced diet.  DRESSING / WOUND CARE / SHOWERING  Keep the surgical dressing until follow up.  The dressing is water  proof, so you can shower without any extra covering.  IF THE DRESSING FALLS OFF or the wound gets wet inside, change the dressing with sterile gauze.  Please use good hand washing techniques before changing the dressing.  Do not use any lotions or creams on the incision until instructed by your surgeon.    ACTIVITY  Increase activity slowly as tolerated, but follow the weight bearing instructions below.   No driving for 6 weeks or until further direction given by your physician.  You cannot drive while taking narcotics.  No lifting or carrying greater than 10 lbs. until further directed by your surgeon. Avoid periods of inactivity such as sitting longer than an hour when not asleep. This helps prevent blood clots.  You may return to work once you are authorized by your doctor.     WEIGHT BEARING   Weight bearing as tolerated with assist device (walker, cane,  etc) as directed, use it as long as suggested by your surgeon or therapist, typically at least 4-6 weeks.   EXERCISES  Results after joint replacement surgery are often greatly improved when you follow the exercise, range of motion and muscle strengthening exercises prescribed by your doctor. Safety measures are also important to protect the joint from further injury. Any time any of these exercises cause you to have increased pain or swelling, decrease what you are doing until you are comfortable again and then slowly increase them. If you have problems or questions, call your caregiver or physical therapist for advice.   Rehabilitation is important following a joint replacement. After just a few days of immobilization, the muscles of the leg can become weakened and shrink (atrophy).  These exercises are designed to build up the tone and strength of the thigh and leg muscles and to improve motion. Often times heat used for twenty to thirty minutes before working out will loosen up your tissues and help with improving the range of motion but do not use heat for the first two weeks following surgery (sometimes heat can increase post-operative swelling).   These exercises can be done on a training (exercise) mat, on the floor, on a table or on a bed. Use whatever works the best and is most comfortable for you.    Use music or television while you are exercising so that the exercises are a pleasant break in your  day. This will make your life better with the exercises acting as a break in your routine that you can look forward to.   Perform all exercises about fifteen times, three times per day or as directed.  You should exercise both the operative leg and the other leg as well.  Exercises include:   Quad Sets - Tighten up the muscle on the front of the thigh (Quad) and hold for 5-10 seconds.   Straight Leg Raises - With your knee straight (if you were given a brace, keep it on), lift the leg to 60  degrees, hold for 3 seconds, and slowly lower the leg.  Perform this exercise against resistance later as your leg gets stronger.  Leg Slides: Lying on your back, slowly slide your foot toward your buttocks, bending your knee up off the floor (only go as far as is comfortable). Then slowly slide your foot back down until your leg is flat on the floor again.  Angel Wings: Lying on your back spread your legs to the side as far apart as you can without causing discomfort.  Hamstring Strength:  Lying on your back, push your heel against the floor with your leg straight by tightening up the muscles of your buttocks.  Repeat, but this time bend your knee to a comfortable angle, and push your heel against the floor.  You may put a pillow under the heel to make it more comfortable if necessary.   A rehabilitation program following joint replacement surgery can speed recovery and prevent re-injury in the future due to weakened muscles. Contact your doctor or a physical therapist for more information on knee rehabilitation.    CONSTIPATION  Constipation is defined medically as fewer than three stools per week and severe constipation as less than one stool per week.  Even if you have a regular bowel pattern at home, your normal regimen is likely to be disrupted due to multiple reasons following surgery.  Combination of anesthesia, postoperative narcotics, change in appetite and fluid intake all can affect your bowels.   YOU MUST use at least one of the following options; they are listed in order of increasing strength to get the job done.  They are all available over the counter, and you may need to use some, POSSIBLY even all of these options:    Drink plenty of fluids (prune juice may be helpful) and high fiber foods Colace 100 mg by mouth twice a day  Senokot for constipation as directed and as needed Dulcolax (bisacodyl ), take with full glass of water   Miralax  (polyethylene glycol) once or twice a day as  needed.  If you have tried all these things and are unable to have a bowel movement in the first 3-4 days after surgery call either your surgeon or your primary doctor.    If you experience loose stools or diarrhea, hold the medications until you stool forms back up.  If your symptoms do not get better within 1 week or if they get worse, check with your doctor.  If you experience the worst abdominal pain ever or develop nausea or vomiting, please contact the office immediately for further recommendations for treatment.   ITCHING:  If you experience itching with your medications, try taking only a single pain pill, or even half a pain pill at a time.  You can also use Benadryl  over the counter for itching or also to help with sleep.   TED HOSE STOCKINGS:  Use stockings on both  legs until for at least 2 weeks or as directed by physician office. They may be removed at night for sleeping.  MEDICATIONS:  See your medication summary on the "After Visit Summary" that nursing will review with you.  You may have some home medications which will be placed on hold until you complete the course of blood thinner medication.  It is important for you to complete the blood thinner medication as prescribed.  PRECAUTIONS:  If you experience chest pain or shortness of breath - call 911 immediately for transfer to the hospital emergency department.   If you develop a fever greater that 101 F, purulent drainage from wound, increased redness or drainage from wound, foul odor from the wound/dressing, or calf pain - CONTACT YOUR SURGEON.                                                   FOLLOW-UP APPOINTMENTS:  If you do not already have a post-op appointment, please call the office for an appointment to be seen by your surgeon.  Guidelines for how soon to be seen are listed in your "After Visit Summary", but are typically between 1-4 weeks after surgery.  OTHER INSTRUCTIONS:   Knee Replacement:  Do not place pillow  under knee, focus on keeping the knee straight while resting. CPM instructions: 0-90 degrees, 2 hours in the morning, 2 hours in the afternoon, and 2 hours in the evening. Place foam block, curve side up under heel at all times except when in CPM or when walking.  DO NOT modify, tear, cut, or change the foam block in any way.  POST-OPERATIVE OPIOID TAPER INSTRUCTIONS: It is important to wean off of your opioid medication as soon as possible. If you do not need pain medication after your surgery it is ok to stop day one. Opioids include: Codeine, Hydrocodone(Norco, Vicodin), Oxycodone (Percocet, oxycontin ) and hydromorphone  amongst others.  Long term and even short term use of opiods can cause: Increased pain response Dependence Constipation Depression Respiratory depression And more.  Withdrawal symptoms can include Flu like symptoms Nausea, vomiting And more Techniques to manage these symptoms Hydrate well Eat regular healthy meals Stay active Use relaxation techniques(deep breathing, meditating, yoga) Do Not substitute Alcohol to help with tapering If you have been on opioids for less than two weeks and do not have pain than it is ok to stop all together.  Plan to wean off of opioids This plan should start within one week post op of your joint replacement. Maintain the same interval or time between taking each dose and first decrease the dose.  Cut the total daily intake of opioids by one tablet each day Next start to increase the time between doses. The last dose that should be eliminated is the evening dose.   MAKE SURE YOU:  Understand these instructions.  Get help right away if you are not doing well or get worse.    Thank you for letting us  be a part of your medical care team.  It is a privilege we respect greatly.  We hope these instructions will help you stay on track for a fast and full recovery!     AMBULATORY SURGERY  DISCHARGE INSTRUCTIONS   The drugs that you  were given will stay in your system until tomorrow so for the next 24 hours you should  not:  Drive an automobile Make any legal decisions Drink any alcoholic beverage   You may resume regular meals tomorrow.  Today it is better to start with liquids and gradually work up to solid foods.  You may eat anything you prefer, but it is better to start with liquids, then soup and crackers, and gradually work up to solid foods.   Please notify your doctor immediately if you have any unusual bleeding, trouble breathing, redness and pain at the surgery site, drainage, fever, or pain not relieved by medication.    Additional Instructions:        Please contact your physician with any problems or Same Day Surgery at [phone], Monday through Friday 6 am to 4 pm, or [place] at [facility] number at [phone].

## 2024-04-30 NOTE — Anesthesia Procedure Notes (Signed)
 Anesthesia Regional Block: Adductor canal block   Pre-Anesthetic Checklist: , timeout performed,  Correct Patient, Correct Site, Correct Laterality,  Correct Procedure, Correct Position, site marked,  Risks and benefits discussed,  Pre-op  evaluation,  At surgeon's request and post-op pain management  Laterality: Right  Prep: Maximum Sterile Barrier Precautions used, chloraprep       Needles:  Injection technique: Single-shot  Needle Type: Echogenic Stimulator Needle     Needle Length: 9cm  Needle Gauge: 21     Additional Needles:   Procedures:,,,, ultrasound used (permanent image in chart),,    Narrative:  Start time: 04/30/2024 8:50 AM End time: 04/30/2024 9:00 AM Injection made incrementally with aspirations every 5 mL.  Performed by: Personally  Anesthesiologist: Epifanio Fallow, MD

## 2024-04-30 NOTE — Anesthesia Procedure Notes (Signed)
 Spinal  Patient location during procedure: OR Start time: 04/30/2024 9:44 AM End time: 04/30/2024 9:47 AM Reason for block: surgical anesthesia Staffing Performed: anesthesiologist  Anesthesiologist: Epifanio Fallow, MD Performed by: Epifanio Fallow, MD Authorized by: Epifanio Fallow, MD   Preanesthetic Checklist Completed: patient identified, IV checked, risks and benefits discussed, surgical consent, monitors and equipment checked, pre-op  evaluation and timeout performed Spinal Block Patient position: sitting Prep: DuraPrep Patient monitoring: cardiac monitor, continuous pulse ox and blood pressure Approach: midline Location: L3-4 Injection technique: single-shot Needle Needle type: Pencan  Needle gauge: 24 G Needle length: 9 cm Assessment Sensory level: T8 Events: CSF return Additional Notes Functioning IV was confirmed and monitors were applied. Sterile prep and drape, including hand hygiene and sterile gloves were used. The patient was positioned and the spine was prepped. The skin was anesthetized with lidocaine .  Free flow of clear CSF was obtained prior to injecting local anesthetic into the CSF.  The spinal needle aspirated freely following injection.  The needle was carefully withdrawn.  The patient tolerated the procedure well.

## 2024-04-30 NOTE — H&P (Signed)
 TOTAL KNEE ADMISSION H&P  Patient is being admitted for right total knee arthroplasty.  Therapy Plans: outpatient therapy at [facility] Disposition: Home with daughter/s Planned DVT Prophylaxis: aspirin  81mg  BID DME needed: ice machine PCP: Dr. Elsie Sharps - (endocrine but acts as his PCP) , clearance received TXA: IV Allergies: scallops Anesthesia Concerns: none BMI: 30.4 Last HgbA1c: 6.8%  Other:  - Prefers SDD - Will go to his house a few days - then to stay with daughter in [place] - No hx of VTE or cancer - hx of TIA - oxycodone , robaxin , tylenol , celebrex  - ICE MACHINE ** - Cortisone injection in the left knee at H&P    Subjective:  Chief Complaint:right knee pain.  HPI: Alexander Morris, 78 y.o. male, has a history of pain and functional disability in the right knee due to arthritis and has failed non-surgical conservative treatments for greater than 12 weeks to includeNSAID's and/or analgesics, corticosteriod injections, and activity modification.  Onset of symptoms was gradual, starting 2 years ago with gradually worsening course since that time. The patient noted no past surgery on the right knee(s).  Patient currently rates pain in the right knee(s) at 8 out of 10 with activity. Patient has worsening of pain with activity and weight bearing and pain that interferes with activities of daily living.  Patient has evidence of joint space narrowing by imaging studies. There is no active infection.  There are no active problems to display for this patient.  Past Medical History:  Diagnosis Date   Arthritis    MINOR - HANDS   CKD (chronic kidney disease)    Diabetes mellitus    PT ON INSULIN  - TYPE II   GERD (gastroesophageal reflux disease)    History of kidney stones    MOST RECENT - 01/06/14 - HAD CYSTO AND STENT PLACEMENT -LEFT   Hypertension    Stroke (HCC) 10/24/2010   MINI-STROKE-EXPERIENCE SLIGHT NUMBNESS LEFT SIDE AND ELEVATED B/P--STILL HAS SOME NUMBNESS  AND WEAKNESS BUT ABLE TO USE ARM AND LEG    Past Surgical History:  Procedure Laterality Date   2010 REMOVAL OF KIDNEY STONE / CYST0     CYSTOSCOPY WITH RETROGRADE PYELOGRAM, URETEROSCOPY AND STENT PLACEMENT Left 01/06/2014   Procedure: CYSTOSCOPY WITH  LEFT RETROGRADE PYELOGRAM, AND LEFT STENT PLACEMENT ;  Surgeon: Toribio Neysa Repine, MD;  Location: [facility];  Service: Urology;  Laterality: Left;   CYSTOSCOPY WITH RETROGRADE PYELOGRAM, URETEROSCOPY AND STENT PLACEMENT Left 01/28/2014   Procedure: CYSTOSCOPY WITH RETROGRADE PYELOGRAM, URETEROSCOPY AND STENT PLACEMENT;  Surgeon: Toribio Neysa Repine, MD;  Location: [facility];  Service: Urology;  Laterality: Left;  LEFT URETER STENT EXCHANGE  LEFT URETER DILATION         EYE SURGERY     LASER BOTH EYES FOR DIABETIC COMPLICATIONS   HOLMIUM LASER APPLICATION Left 01/28/2014   Procedure: HOLMIUM LASER APPLICATION;  Surgeon: Toribio Neysa Repine, MD;  Location: [facility];  Service: Urology;  Laterality: Left;   KNEE ARTHROSCOPY W/ ACL RECONSTRUCTION Right 1970    No current facility-administered medications for this encounter.   Current Outpatient Medications  Medication Sig Dispense Refill Last Dose/Taking   acetaminophen  (TYLENOL ) 500 MG tablet Take 500-1,000 mg by mouth every 6 (six) hours as needed (pain.).   Taking As Needed   aspirin  EC 81 MG tablet Take 81 mg by mouth in the morning. Swallow whole.   Taking   diltiazem  (CARDIZEM  CD) 300 MG 24 hr capsule Take 300 mg by mouth  daily.   Taking   gabapentin  (NEURONTIN ) 300 MG capsule Take 300 mg by mouth daily.   Taking   insulin  degludec (TRESIBA ) 200 UNIT/ML FlexTouch Pen Inject 100 Units into the skin at bedtime.   Taking   pioglitazone  (ACTOS ) 45 MG tablet Take 45 mg by mouth every morning.    Taking   Polyethyl Glycol-Propyl Glycol (SYSTANE) 0.4-0.3 % SOLN Place 1 drop into both eyes 3 (three) times daily as needed (dry eyes).   Taking As Needed   ramipril  (ALTACE ) 10 MG capsule Take  10 mg by mouth every morning.    Taking   Allergies  Allergen Reactions   Scallops [Shellfish Allergy] Nausea And Vomiting    Social History   Tobacco Use   Smoking status: Never   Smokeless tobacco: Never  Substance Use Topics   Alcohol use: Yes    Comment: occasional    No family history on file.   Review of Systems  Constitutional:  Negative for chills and fever.  Respiratory:  Negative for cough and shortness of breath.   Cardiovascular:  Negative for chest pain.  Gastrointestinal:  Negative for nausea and vomiting.  Musculoskeletal:  Positive for arthralgias.     Objective:  Physical Exam Well nourished and well developed. General: Alert and oriented x3, cooperative and pleasant, no acute distress.  Musculoskeletal: Right knee exam: No palpable effusion, warmth or erythema Genu varum associated with slight flexion contracture with flexion over 100 degrees Tenderness medially Stable medial and lateral collateral ligaments  Left Knee: No erythema or warmth Moderate effusion AROM 0-110  Vital signs in last 24 hours:    Labs:   Estimated body mass index is 31.17 kg/m as calculated from the following:   Height as of 04/22/24: 5' 8 (1.727 m).   Weight as of 04/22/24: 93 kg.   Imaging Review Plain radiographs demonstrate severe degenerative joint disease of the right knee(s). The overall alignment isneutral. The bone quality appears to be adequate for age and reported activity level.      Assessment/Plan:  End stage arthritis, right knee   The patient history, physical examination, clinical judgment of the provider and imaging studies are consistent with end stage degenerative joint disease of the right knee(s) and total knee arthroplasty is deemed medically necessary. The treatment options including medical management, injection therapy arthroscopy and arthroplasty were discussed at length. The risks and benefits of total knee arthroplasty were  presented and reviewed. The risks due to aseptic loosening, infection, stiffness, patella tracking problems, thromboembolic complications and other imponderables were discussed. The patient acknowledged the explanation, agreed to proceed with the plan and consent was signed. Patient is being admitted for inpatient treatment for surgery, pain control, PT, OT, prophylactic antibiotics, VTE prophylaxis, progressive ambulation and ADL's and discharge planning. The patient is planning to be discharged home.     Patient's anticipated LOS is less than 2 midnights, meeting these requirements: - Younger than 57 - Lives within 1 hour of care - Has a competent adult at home to recover with post-op recover - NO history of  - Chronic pain requiring opiods  - Diabetes  - Coronary Artery Disease  - Heart failure  - Heart attack  - Stroke  - DVT/VTE  - Cardiac arrhythmia  - Respiratory Failure/COPD  - Renal failure  - Anemia  - Advanced Liver disease  Rosina Calin, PA-C Orthopedic Surgery [facility] [place] [phone]

## 2024-05-01 ENCOUNTER — Encounter (HOSPITAL_COMMUNITY): Payer: Self-pay | Admitting: Orthopedic Surgery

## 2024-05-01 DIAGNOSIS — M1711 Unilateral primary osteoarthritis, right knee: Secondary | ICD-10-CM | POA: Diagnosis not present

## 2024-05-01 LAB — CBC
HCT: 32.4 % — ABNORMAL LOW (ref 39.0–52.0)
Hemoglobin: 10.3 g/dL — ABNORMAL LOW (ref 13.0–17.0)
MCH: 30.8 pg (ref 26.0–34.0)
MCHC: 31.8 g/dL (ref 30.0–36.0)
MCV: 97 fL (ref 80.0–100.0)
Platelets: 163 K/uL (ref 150–400)
RBC: 3.34 MIL/uL — ABNORMAL LOW (ref 4.22–5.81)
RDW: 13.9 % (ref 11.5–15.5)
WBC: 8.8 K/uL (ref 4.0–10.5)
nRBC: 0 % (ref 0.0–0.2)

## 2024-05-01 LAB — BASIC METABOLIC PANEL WITH GFR
Anion gap: 9 (ref 5–15)
BUN: 39 mg/dL — ABNORMAL HIGH (ref 8–23)
CO2: 22 mmol/L (ref 22–32)
Calcium: 8.4 mg/dL — ABNORMAL LOW (ref 8.9–10.3)
Chloride: 104 mmol/L (ref 98–111)
Creatinine, Ser: 2.04 mg/dL — ABNORMAL HIGH (ref 0.61–1.24)
GFR, Estimated: 33 mL/min — ABNORMAL LOW (ref >60)
Glucose, Bld: 139 mg/dL — ABNORMAL HIGH (ref 70–99)
Potassium: 5.1 mmol/L (ref 3.5–5.1)
Sodium: 135 mmol/L (ref 135–145)

## 2024-05-01 MED ORDER — ASPIRIN 81 MG PO CHEW
81.0000 mg | CHEWABLE_TABLET | Freq: Two times a day (BID) | ORAL | Status: AC
Start: 2024-05-01 — End: ?

## 2024-05-01 NOTE — Care Management Obs Status (Signed)
 MEDICARE OBSERVATION STATUS NOTIFICATION   Patient Details  Name: Alexander Morris MRN: [id number] Date of Birth: 09/10/1946   Medicare Observation Status Notification Given:  Yes    HOYLEDORIAN, LCSW 05/01/2024, 11:20 AM

## 2024-05-01 NOTE — Progress Notes (Signed)
Discharge instructions given to patient and family, questions asked and answered D Mateo Flow RN

## 2024-05-01 NOTE — TOC Transition Note (Signed)
 Transition of Care [facility]) - Discharge Note   Patient Details  Name: Alexander Morris MRN: [id number] Date of Birth: 04/30/1946  Transition of [facility] CM/SW Contact:  NORMAN ASPEN, LCSW Phone Number: 05/01/2024, 9:53 AM   Clinical Narrative:     Met with pt who confirms he has needed DME in the home. OPPT already arranged with [facility] PT.  No further TOC needs.  Final next level of care: OP Rehab Barriers to Discharge: No Barriers Identified   Patient Goals and CMS Choice Patient states their goals for this hospitalization and ongoing recovery are:: return home          Discharge Placement                       Discharge Plan and Services Additional resources added to the After Visit Summary for                  DME Arranged: N/A DME Agency: NA                  Social Drivers of Health (SDOH) Interventions SDOH Screenings   Food Insecurity: No Food Insecurity (04/30/2024)  Housing: Low Risk  (04/30/2024)  Transportation Needs: No Transportation Needs (04/30/2024)  Utilities: Not At Risk (04/30/2024)  Social Connections: Socially Isolated (04/30/2024)  Tobacco Use: Low Risk  (04/30/2024)  Recent Concern: Tobacco Use - Medium Risk (04/18/2024)   Received from [facility]     Readmission Risk Interventions     No data to display

## 2024-05-01 NOTE — Progress Notes (Signed)
 Physical Therapy Treatment Patient Details Name: Alexander Morris MRN: [id number] DOB: 03/02/1946 Today's Date: 05/01/2024   History of Present Illness 78 yo male presents to therapy s/p R TKA on 04/30/2024 due to failure of conservative measures. Pt PMH includes but is not limited to: arthritis, CKD, DM II, GERD, kidney stones, HTN, and CVA.    PT Comments  POD # 1 pm session Daughter present for Family training.  General transfer comment: 25% VC's on proper hand placement and safety with turns in the bathroom.  Had Daughter hands on assist using safety belt. General Gait Details: Had Daughter hands on asisst with amb using safety belt to and from stairs. General stair comments: with Daughter hands on using safety asissted Pt up/down 2 steps twice usinf walker forward approach while Daughter secured walker.  Addressed all mobility questions, discussed appropriate activity, educated on use of ICE MAN.  Pt ready for D/C to home.    If plan is discharge home, recommend the following: A little help with walking and/or transfers;A little help with bathing/dressing/bathroom;Assistance with cooking/housework;Assist for transportation;Help with stairs or ramp for entrance   Can travel by private vehicle        Equipment Recommendations  None recommended by PT    Recommendations for Other Services       Precautions / Restrictions Precautions Precautions: Knee;Fall Precaution/Restrictions Comments: no pillow under knee Restrictions Weight Bearing Restrictions Per Provider Order: No     Mobility  Bed Mobility General bed mobility comments: OOB in recliner    Transfers Overall transfer level: (P) Needs assistance Equipment used: (P) Rolling walker (2 wheels) Transfers: (P) Sit to/from Stand             General transfer comment: 25% VC's on proper hand placement and safety with turns in the bathroom.  Had Daughter hands on assist using safety belt.     Ambulation/Gait Ambulation/Gait assistance: Supervision Gait Distance (Feet): 25 Feet Assistive device: Rolling walker (2 wheels) Gait Pattern/deviations: Step-to pattern, Decreased stance time - right, Antalgic, Trunk flexed Gait velocity: decreased     General Gait Details: Had Daughter hands on asisst with amb using safety belt to and from stairs.   Stairs Stairs: Yes Stairs assistance: Min assist Stair Management: No rails, Step to pattern, Forwards, With walker Number of Stairs: 4 General stair comments: with Daughter hands on using safety asissted Pt up/down 2 steps twice usinf walker forward approach while Daughter secured walker   Wheelchair Mobility     Tilt Bed    Modified Rankin (Stroke Patients Only)       Balance                                            Communication    Cognition Arousal: Alert Behavior During Therapy: WFL for tasks assessed/performed   PT - Cognitive impairments: No apparent impairments                       PT - Cognition Comments: AxO x 3 pleasant and mtivated.  Lives home alone in a secvond floor [place].  Elevator is broke, so he is going to his daughter's home.  He has a walker from a past GF.        Cueing    Exercises      General Comments  Pertinent Vitals/Pain Pain Assessment Pain Assessment: 0-10 Pain Score: 3  Pain Location: R knee with amb Pain Descriptors / Indicators: Operative site guarding, Grimacing, Tightness Pain Intervention(s): Monitored during session, Premedicated before session, Repositioned, Ice applied    Home Living                          Prior Function            PT Goals (current goals can now be found in the care plan section) Progress towards PT goals: Progressing toward goals    Frequency    7X/week      PT Plan      Co-evaluation              AM-PAC PT 6 Clicks Mobility   Outcome Measure  Help needed turning  from your back to your side while in a flat bed without using bedrails?: A Little Help needed moving from lying on your back to sitting on the side of a flat bed without using bedrails?: A Little Help needed moving to and from a bed to a chair (including a wheelchair)?: A Little Help needed standing up from a chair using your arms (e.g., wheelchair or bedside chair)?: A Little Help needed to walk in hospital room?: A Little Help needed climbing 3-5 steps with a railing? : A Little 6 Click Score: 18    End of Session Equipment Utilized During Treatment: Gait belt Activity Tolerance: Patient tolerated treatment well;No increased pain Patient left: in chair;with call bell/phone within reach;with chair alarm set Nurse Communication: Mobility status PT Visit Diagnosis: Unsteadiness on feet (R26.81);Other abnormalities of gait and mobility (R26.89);Muscle weakness (generalized) (M62.81);Difficulty in walking, not elsewhere classified (R26.2)     Time: 8840-8772 PT Time Calculation (min) (ACUTE ONLY): 28 min  Charges:    $Gait Training: 8-22 mins $Therapeutic Activity: 8-22 mins PT General Charges $$ ACUTE PT VISIT: 1 Visit                     Katheryn Leap  PTA Acute  Rehabilitation Services Office M-F          [phone]

## 2024-05-01 NOTE — Progress Notes (Addendum)
 Subjective: 1 Day Post-Op Procedure(s) (LRB): ARTHROPLASTY, KNEE, TOTAL (Right) Patient reports pain as mild.   Patient seen in rounds by Dr. Ernie. Patient is well, and has had no acute complaints or problems. No acute events overnight. Foley catheter removed. Patient has not been up with PT yet.  We will start therapy today.   Objective: Vital signs in last 24 hours: Temp:  [97.6 F (36.4 C)-98.4 F (36.9 C)] 98.4 F (36.9 C) (07/09 0612) Pulse Rate:  [50-58] 58 (07/09 0612) Resp:  [13-20] 17 (07/09 0612) BP: (113-183)/(51-72) 165/66 (07/09 0612) SpO2:  [95 %-99 %] 99 % (07/09 0612)  Intake/Output from previous day:  Intake/Output Summary (Last 24 hours) at 05/01/2024 0755 Last data filed at 05/01/2024 0600 Gross per 24 hour  Intake 2077 ml  Output 1800 ml  Net 277 ml     Intake/Output this shift: No intake/output data recorded.  Labs: Recent Labs    05/01/24 0326  HGB 10.3*   Recent Labs    05/01/24 0326  WBC 8.8  RBC 3.34*  HCT 32.4*  PLT 163   Recent Labs    05/01/24 0326  NA 135  K 5.1  CL 104  CO2 22  BUN 39*  CREATININE 2.04*  GLUCOSE 139*  CALCIUM 8.4*   No results for input(s): LABPT, INR in the last 72 hours.  Exam: General - Patient is Alert and Oriented Extremity - Neurologically intact Sensation intact distally Intact pulses distally Dorsiflexion/Plantar flexion intact Dressing - dressing C/D/I Motor Function - intact, moving foot and toes well on exam.   Past Medical History:  Diagnosis Date   Arthritis    MINOR - HANDS   CKD (chronic kidney disease)    Diabetes mellitus    PT ON INSULIN  - TYPE II   GERD (gastroesophageal reflux disease)    History of kidney stones    MOST RECENT - 01/06/14 - HAD CYSTO AND STENT PLACEMENT -LEFT   Hypertension    Stroke (HCC) 10/24/2010   MINI-STROKE-EXPERIENCE SLIGHT NUMBNESS LEFT SIDE AND ELEVATED B/P--STILL HAS SOME NUMBNESS AND WEAKNESS BUT ABLE TO USE ARM AND LEG     Assessment/Plan: 1 Day Post-Op Procedure(s) (LRB): ARTHROPLASTY, KNEE, TOTAL (Right) Principal Problem:   S/P total knee replacement, right Active Problems:   S/P total knee arthroplasty, right  Estimated body mass index is 31.17 kg/m as calculated from the following:   Height as of this encounter: 5' 8 (1.727 m).   Weight as of this encounter: 93 kg. Advance diet Up with therapy D/C IV fluids   Patient's anticipated LOS is less than 2 midnights, meeting these requirements: - Younger than 59 - Lives within 1 hour of care - Has a competent adult at home to recover with post-op recover - NO history of  - Chronic pain requiring opiods  - Diabetes  - Coronary Artery Disease  - Heart failure  - Heart attack  - Stroke  - DVT/VTE  - Cardiac arrhythmia  - Respiratory Failure/COPD  - Renal failure  - Anemia  - Advanced Liver disease     DVT Prophylaxis - Aspirin  Weight bearing as tolerated.  Hgb stable at 10.3 this AM Cr. 2.04 which is near baseline  Patient had significant untreated sleep apnea which led us  to keep him overnight Will make recs for PCP to have sleep study done   All meds sent prior to surgery as we anticipated SDD  Plan is to go Home after hospital stay. Plan for discharge  today following 1-2 sessions of PT as long as they are meeting their goals. Patient is scheduled for OPPT. Follow up in the office in 2 weeks.   Rosina Calin, PA-C Orthopedic Surgery [phone] 05/01/2024, 7:55 AM

## 2024-05-01 NOTE — Progress Notes (Signed)
 Physical Therapy Treatment Patient Details Name: Alexander Morris MRN: [id number] DOB: [birth year] Today's Date: 05/01/2024   History of Present Illness 78 yo male presents to therapy s/p R TKA on 04/30/2024 due to failure of conservative measures. Pt PMH includes but is not limited to: arthritis, CKD, DM II, GERD, kidney stones, HTN, and CVA.    PT Comments  POD # 1 am session AxO x 3 pleasant and mtivated.  Lives home alone in a secvond floor [place].  Elevator is broke, so he is going to his daughter's home.  She has 3 steps/no rails to enter. He has a walker from a past GF. Assisted OOB to amb in hallway then to bathroom.  Pt slow but steady.   Then returned to room to perform some TE's following HEP handout.  Instructed on proper tech, freq as well as use of ICE.   Will see Pt again this afternoon when Daughter arrives for Familt Ed and stair training.    If plan is discharge home, recommend the following: A little help with walking and/or transfers;A little help with bathing/dressing/bathroom;Assistance with cooking/housework;Assist for transportation;Help with stairs or ramp for entrance   Can travel by private vehicle        Equipment Recommendations  None recommended by PT    Recommendations for Other Services       Precautions / Restrictions Precautions Precautions: Knee;Fall Precaution/Restrictions Comments: no pillow under knee Restrictions Weight Bearing Restrictions Per Provider Order: No     Mobility  Bed Mobility Overal bed mobility: (P) Needs Assistance Bed Mobility: (P) Supine to Sit     Supine to sit: (P) Supervision, HOB elevated, Used rails     General bed mobility comments: (P) self able to perform a SLR and self able to trasnfer to EOB.    Transfers Overall transfer level: (P) Needs assistance Equipment used: (P) Rolling walker (2 wheels) Transfers: (P) Sit to/from Stand             General transfer comment: 25% VC's on proper hand placement and  safety with turns in the bathroom.    Ambulation/Gait Ambulation/Gait assistance: Contact guard assist, Supervision Gait Distance (Feet): 58 Feet Assistive device: Rolling walker (2 wheels) Gait Pattern/deviations: Step-to pattern, Decreased stance time - right, Antalgic, Trunk flexed Gait velocity: decreased     General Gait Details: VC's on proper walker to self placement and safety with turns.  Tolerated a functional distance with 3/10 knee pain.   Stairs             Wheelchair Mobility     Tilt Bed    Modified Rankin (Stroke Patients Only)       Balance                                            Communication    Cognition Arousal: Alert Behavior During Therapy: WFL for tasks assessed/performed   PT - Cognitive impairments: No apparent impairments                       PT - Cognition Comments: AxO x 3 pleasant and mtivated.  Lives home alone in a secvond floor [place].  Elevator is broke, so he is going to his daughter's home.  He has a walker from a past GF.        Cueing    Exercises  Total Knee Replacement TE's following HEP handout 10 reps B LE ankle pumps 05 reps towel squeezes 05 reps knee presses 05 reps heel slides  05 reps SAQ's 05 reps SLR's 05 reps ABD Educated on use of gait belt to assist with TE's Followed by ICE     General Comments        Pertinent Vitals/Pain Pain Assessment Pain Assessment: 0-10 Pain Score: 3  Pain Location: R knee with amb Pain Descriptors / Indicators: Operative site guarding, Grimacing, Tightness Pain Intervention(s): Monitored during session, Premedicated before session, Repositioned, Ice applied    Home Living                          Prior Function            PT Goals (current goals can now be found in the care plan section) Progress towards PT goals: Progressing toward goals    Frequency    7X/week      PT Plan      Co-evaluation               AM-PAC PT 6 Clicks Mobility   Outcome Measure  Help needed turning from your back to your side while in a flat bed without using bedrails?: A Little Help needed moving from lying on your back to sitting on the side of a flat bed without using bedrails?: A Little Help needed moving to and from a bed to a chair (including a wheelchair)?: A Little Help needed standing up from a chair using your arms (e.g., wheelchair or bedside chair)?: A Little Help needed to walk in hospital room?: A Little Help needed climbing 3-5 steps with a railing? : A Little 6 Click Score: 18    End of Session Equipment Utilized During Treatment: Gait belt Activity Tolerance: Patient tolerated treatment well;No increased pain Patient left: in chair;with call bell/phone within reach;with chair alarm set Nurse Communication: Mobility status PT Visit Diagnosis: Unsteadiness on feet (R26.81);Other abnormalities of gait and mobility (R26.89);Muscle weakness (generalized) (M62.81);Difficulty in walking, not elsewhere classified (R26.2)     Time: 9066-8997 PT Time Calculation (min) (ACUTE ONLY): 29 min  Charges:    $Gait Training: 8-22 mins $Therapeutic Exercise: 8-22 mins PT General Charges $$ ACUTE PT VISIT: 1 Visit                     Katheryn Leap  PTA Acute  Rehabilitation Services Office M-F          [phone]

## 2024-05-09 NOTE — Discharge Summary (Signed)
 Patient ID: Alexander Morris MRN: [id number] DOB/AGE: Dec 25, 1945 78 y.o.  Admit date: 04/30/2024 Discharge date: 05/01/2024  Admission Diagnoses:  Right knee osteoarthritis   Discharge Diagnoses:  Principal Problem:   S/P total knee replacement, right Active Problems:   S/P total knee arthroplasty, right   Past Medical History:  Diagnosis Date   Arthritis    MINOR - HANDS   CKD (chronic kidney disease)    Diabetes mellitus    PT ON INSULIN  - TYPE II   GERD (gastroesophageal reflux disease)    History of kidney stones    MOST RECENT - 01/06/14 - HAD CYSTO AND STENT PLACEMENT -LEFT   Hypertension    Stroke (HCC) 10/24/2010   MINI-STROKE-EXPERIENCE SLIGHT NUMBNESS LEFT SIDE AND ELEVATED B/P--STILL HAS SOME NUMBNESS AND WEAKNESS BUT ABLE TO USE ARM AND LEG    Surgeries: Procedure(s): ARTHROPLASTY, KNEE, TOTAL on 04/30/2024   Consultants:   Discharged Condition: Improved  Hospital Course: Alexander Morris is an 78 y.o. male who was admitted 04/30/2024 for operative treatment ofS/P total knee replacement, right. Patient has severe unremitting pain that affects sleep, daily activities, and work/hobbies. After pre-op  clearance the patient was taken to the operating room on 04/30/2024 and underwent  Procedure(s): ARTHROPLASTY, KNEE, TOTAL.    Patient was given perioperative antibiotics:  Anti-infectives (From admission, onward)    Start     Dose/Rate Route Frequency Ordered Stop   04/30/24 1600  ceFAZolin  (ANCEF ) IVPB 2g/100 mL premix        2 g 200 mL/hr over 30 Minutes Intravenous Every 6 hours 04/30/24 1334 05/01/24 0853   04/30/24 0745  ceFAZolin  (ANCEF ) IVPB 2g/100 mL premix        2 g 200 mL/hr over 30 Minutes Intravenous On call to O.R. 04/30/24 0736 04/30/24 1007        Patient was given sequential compression devices, early ambulation, and chemoprophylaxis to prevent DVT. Patient worked with PT and was meeting their goals regarding safe ambulation and transfers.  Patient  benefited maximally from hospital stay and there were no complications.    Recent vital signs: No data found.   Recent laboratory studies: No results for input(s): WBC, HGB, HCT, PLT, NA, K, CL, CO2, BUN, CREATININE, GLUCOSE, INR, CALCIUM in the last 72 hours.  Invalid input(s): PT, 2   Discharge Medications:   Allergies as of 05/01/2024       Reactions   Scallops [shellfish Allergy] Nausea And Vomiting        Medication List     STOP taking these medications    aspirin  EC 81 MG tablet Replaced by: aspirin  81 MG chewable tablet       TAKE these medications    acetaminophen  500 MG tablet Commonly known as: TYLENOL  Take 500-1,000 mg by mouth every 6 (six) hours as needed (pain.).   aspirin  81 MG chewable tablet Chew 1 tablet (81 mg total) by mouth 2 (two) times daily. Replaces: aspirin  EC 81 MG tablet   diltiazem  300 MG 24 hr capsule Commonly known as: CARDIZEM  CD Take 300 mg by mouth daily.   gabapentin  300 MG capsule Commonly known as: NEURONTIN  Take 300 mg by mouth daily.   insulin  degludec 200 UNIT/ML FlexTouch Pen Commonly known as: TRESIBA  Inject 100 Units into the skin at bedtime.   pioglitazone  45 MG tablet Commonly known as: ACTOS  Take 45 mg by mouth every morning.   ramipril  10 MG capsule Commonly known as: ALTACE  Take 10 mg by mouth every morning.  Systane 0.4-0.3 % Soln Generic drug: Polyethyl Glycol-Propyl Glycol Place 1 drop into both eyes 3 (three) times daily as needed (dry eyes).               Discharge Care Instructions  (From admission, onward)           Start     Ordered   05/01/24 0000  Change dressing       Comments: Maintain surgical dressing until follow up in the clinic. If the edges start to pull up, may reinforce with tape. If the dressing is no longer working, may remove and cover with gauze and tape, but must keep the area dry and clean.  Call with any questions or concerns.    05/01/24 0758            Diagnostic Studies: No results found.  Disposition: Discharge disposition: 01-Home or Self Care       Discharge Instructions     Call MD / Call 911   Complete by: As directed    If you experience chest pain or shortness of breath, CALL 911 and be transported to the hospital emergency room.  If you develope a fever above 101 F, pus (white drainage) or increased drainage or redness at the wound, or calf pain, call your surgeon's office.   Change dressing   Complete by: As directed    Maintain surgical dressing until follow up in the clinic. If the edges start to pull up, may reinforce with tape. If the dressing is no longer working, may remove and cover with gauze and tape, but must keep the area dry and clean.  Call with any questions or concerns.   Constipation Prevention   Complete by: As directed    Drink plenty of fluids.  Prune juice may be helpful.  You may use a stool softener, such as Colace (over the counter) 100 mg twice a day.  Use MiraLax  (over the counter) for constipation as needed.   Diet - low sodium heart healthy   Complete by: As directed    Increase activity slowly as tolerated   Complete by: As directed    Weight bearing as tolerated with assist device (walker, cane, etc) as directed, use it as long as suggested by your surgeon or therapist, typically at least 4-6 weeks.   Post-operative opioid taper instructions:   Complete by: As directed    POST-OPERATIVE OPIOID TAPER INSTRUCTIONS: It is important to wean off of your opioid medication as soon as possible. If you do not need pain medication after your surgery it is ok to stop day one. Opioids include: Codeine, Hydrocodone(Norco, Vicodin), Oxycodone (Percocet, oxycontin ) and hydromorphone  amongst others.  Long term and even short term use of opiods can cause: Increased pain response Dependence Constipation Depression Respiratory depression And more.  Withdrawal symptoms can  include Flu like symptoms Nausea, vomiting And more Techniques to manage these symptoms Hydrate well Eat regular healthy meals Stay active Use relaxation techniques(deep breathing, meditating, yoga) Do Not substitute Alcohol to help with tapering If you have been on opioids for less than two weeks and do not have pain than it is ok to stop all together.  Plan to wean off of opioids This plan should start within one week post op of your joint replacement. Maintain the same interval or time between taking each dose and first decrease the dose.  Cut the total daily intake of opioids by one tablet each day Next start to increase the time between  doses. The last dose that should be eliminated is the evening dose.      TED hose   Complete by: As directed    Use stockings (TED hose) for 2 weeks on both leg(s).  You may remove them at night for sleeping.        Follow-up Information     Ernie Cough, MD. Schedule an appointment as soon as possible for a visit in 2 week(s).   Specialty: Orthopedic Surgery Contact information: [address] [place] [address] [phone]         Claudene Elsie JUDITHANN Mickey., MD. Schedule an appointment as soon as possible for a visit.   Specialty: Endocrinology Why: Patient needs sleep study - severe intra-op sleep apnea untreated Contact information: [address] [place] [address] [phone]                  Signed: Rosina JONELLE Calin 05/09/2024, 7:16 AM
